# Patient Record
Sex: Female | Born: 1945 | Race: Black or African American | Hispanic: No | Smoking: Current every day smoker
Health system: Southern US, Community
[De-identification: ages and names within clinical notes are randomized; demographics above are authoritative.]

## PROBLEM LIST (undated history)

## (undated) DIAGNOSIS — I10 Essential (primary) hypertension: Secondary | ICD-10-CM

## (undated) DIAGNOSIS — J449 Chronic obstructive pulmonary disease, unspecified: Secondary | ICD-10-CM

## (undated) DIAGNOSIS — E7800 Pure hypercholesterolemia, unspecified: Secondary | ICD-10-CM

## (undated) DIAGNOSIS — I4891 Unspecified atrial fibrillation: Secondary | ICD-10-CM

## (undated) HISTORY — PX: ABDOMINAL HYSTERECTOMY: SHX81

## (undated) HISTORY — PX: BACK SURGERY: SHX140

---

## 2008-04-07 ENCOUNTER — Observation Stay (HOSPITAL_COMMUNITY): Admission: RE | Admit: 2008-04-07 | Discharge: 2008-04-07 | Payer: Self-pay | Admitting: Neurosurgery

## 2008-05-02 ENCOUNTER — Encounter: Admission: RE | Admit: 2008-05-02 | Discharge: 2008-05-02 | Payer: Self-pay | Admitting: Neurosurgery

## 2008-05-07 ENCOUNTER — Ambulatory Visit (HOSPITAL_COMMUNITY): Admission: RE | Admit: 2008-05-07 | Discharge: 2008-05-08 | Payer: Self-pay | Admitting: Neurosurgery

## 2008-07-27 ENCOUNTER — Encounter: Admission: RE | Admit: 2008-07-27 | Discharge: 2008-10-25 | Payer: Self-pay | Admitting: Neurosurgery

## 2008-12-08 ENCOUNTER — Emergency Department (HOSPITAL_BASED_OUTPATIENT_CLINIC_OR_DEPARTMENT_OTHER): Admission: EM | Admit: 2008-12-08 | Discharge: 2008-12-09 | Payer: Self-pay | Admitting: Emergency Medicine

## 2008-12-08 ENCOUNTER — Ambulatory Visit: Payer: Self-pay | Admitting: Diagnostic Radiology

## 2009-05-19 ENCOUNTER — Emergency Department (HOSPITAL_BASED_OUTPATIENT_CLINIC_OR_DEPARTMENT_OTHER): Admission: EM | Admit: 2009-05-19 | Discharge: 2009-05-19 | Payer: Self-pay | Admitting: Emergency Medicine

## 2009-05-19 ENCOUNTER — Ambulatory Visit: Payer: Self-pay | Admitting: Diagnostic Radiology

## 2011-02-04 LAB — URINE CULTURE: Colony Count: 9000

## 2011-02-04 LAB — CBC
Hemoglobin: 14.1 g/dL (ref 12.0–15.0)
MCHC: 33.2 g/dL (ref 30.0–36.0)
MCV: 89.6 fL (ref 78.0–100.0)
RBC: 4.73 MIL/uL (ref 3.87–5.11)
RDW: 13.9 % (ref 11.5–15.5)

## 2011-02-04 LAB — DIFFERENTIAL
Lymphocytes Relative: 16 % (ref 12–46)
Lymphs Abs: 1.2 10*3/uL (ref 0.7–4.0)
Neutro Abs: 5.7 10*3/uL (ref 1.7–7.7)
Neutrophils Relative %: 78 % — ABNORMAL HIGH (ref 43–77)

## 2011-02-04 LAB — URINALYSIS, ROUTINE W REFLEX MICROSCOPIC
Nitrite: NEGATIVE
Specific Gravity, Urine: 1.026 (ref 1.005–1.030)
Urobilinogen, UA: 1 mg/dL (ref 0.0–1.0)
pH: 5.5 (ref 5.0–8.0)

## 2011-02-04 LAB — COMPREHENSIVE METABOLIC PANEL
CO2: 26 mEq/L (ref 19–32)
Calcium: 8.9 mg/dL (ref 8.4–10.5)
Creatinine, Ser: 1 mg/dL (ref 0.4–1.2)
GFR calc Af Amer: >60 mL/min (ref >60)
GFR calc non Af Amer: 56 mL/min — ABNORMAL LOW (ref >60)
Glucose, Bld: 96 mg/dL (ref 70–99)
Total Protein: 7.3 g/dL (ref 6.0–8.3)

## 2011-02-04 LAB — URINE MICROSCOPIC-ADD ON

## 2011-02-13 LAB — POCT B-TYPE NATRIURETIC PEPTIDE (BNP): B Natriuretic Peptide, POC: 7.5 pg/mL (ref 0–100)

## 2011-02-13 LAB — DIFFERENTIAL
Eosinophils Absolute: 0.4 10*3/uL (ref 0.0–0.7)
Lymphs Abs: 4.9 10*3/uL — ABNORMAL HIGH (ref 0.7–4.0)
Neutro Abs: 4 10*3/uL (ref 1.7–7.7)
Neutrophils Relative %: 40 % — ABNORMAL LOW (ref 43–77)

## 2011-02-13 LAB — CBC
MCV: 91.9 fL (ref 78.0–100.0)
Platelets: 147 10*3/uL — ABNORMAL LOW (ref 150–400)
WBC: 10 10*3/uL (ref 4.0–10.5)

## 2011-02-13 LAB — BASIC METABOLIC PANEL
BUN: 13 mg/dL (ref 6–23)
Calcium: 9.6 mg/dL (ref 8.4–10.5)
Chloride: 106 mEq/L (ref 96–112)
Creatinine, Ser: 0.7 mg/dL (ref 0.4–1.2)
GFR calc non Af Amer: >60 mL/min (ref >60)

## 2011-02-13 LAB — D-DIMER, QUANTITATIVE: D-Dimer, Quant: 0.7 ug/mL-FEU — ABNORMAL HIGH (ref 0.00–0.48)

## 2011-02-13 LAB — POCT CARDIAC MARKERS: CKMB, poc: 1.8 ng/mL (ref 1.0–8.0)

## 2011-03-13 NOTE — Op Note (Signed)
Anne West, Anne West           ACCOUNT NO.:  [id number]   MEDICAL RECORD NO.:  [id number]          PATIENT TYPE:  OBV   LOCATION:  3534                         FACILITY:  [facility]   PHYSICIAN:  Coletta Memos, M.D.     DATE OF BIRTH:  Oct 02, 1946   DATE OF PROCEDURE:  04/07/2008  DATE OF DISCHARGE:  04/07/2008                               OPERATIVE REPORT   PREOPERATIVE DIAGNOSES:  1. Displaced disk, left, L5-S1.  2. Left S1 radiculopathy.   POSTOPERATIVE DIAGNOSES:  1. Displaced disk, left, L5-S1.  2. Left S1 radiculopathy.   PROCEDURE:  Left L5-S1 semi-hemilaminectomy and diskectomy with  microdissection.   COMPLICATIONS:  None.   INDICATIONS:  [name] has had significant pain in the left lower  extremity.  MRI showed a herniated disk at L5-S1.  With no benefit from  conservative treatment I offered, she agreed to undergo operative  decompression.   OPERATIVE NOTE:  [name] was brought to the operating room  intubated, placed under general anesthetic without difficulty.  She was  rolled prone onto a Wilson frame and all pressure points were properly  padded.  Her back was prepped.  She was draped in sterile fashion.  I  infiltrated 0.5% lidocaine and 1:200,000 strength of epinephrine into  the paraspinous musculature.  I opened the skin with a #10 blade and  took this down to the thoracolumbar fascia.  I exposed the lamina of L4,  L5, and S1.  Intraoperative x-ray showed I was in the correct  interlaminar space at L5-S1.  I then performed a semi-hemilaminectomy of  L5 and then removed the ligamentum flavum and exposed the thecal sac  between L5 and S1.  I brought the microscope into the operative field  and with microdissection retracted thecal sac medially and performed a  diskectomy at L5-S1 using pituitary rongeurs, curettes, and Kerrison  punches.  Dr. Channing Mutters assisted with the diskectomy.  After adequate  decompression, I inspected the nerve root and felt that  there was no  pressure on it.  I then irrigated the wound.  I then closed the wound in  layered fashion using Vicryl sutures.  I reapproximated the  thoracolumbar fascia and the subcuticular layer.  Dermabond was used for  sterile dressing.  She tolerated the procedure well.           ______________________________  Coletta Memos, M.D.     KC/MEDQ  D:  04/07/2008  T:  04/07/2008  Job:  [id number]

## 2011-03-13 NOTE — Discharge Summary (Signed)
Anne West, Anne West           ACCOUNT NO.:  [id number]   MEDICAL RECORD NO.:  [id number]          PATIENT TYPE:  OBV   LOCATION:  3534                         FACILITY:  [facility]   PHYSICIAN:  Coletta Memos, M.D.     DATE OF BIRTH:  Dec 02, 1945   DATE OF ADMISSION:  04/07/2008  DATE OF DISCHARGE:  04/07/2008                               DISCHARGE SUMMARY   ADMITTING DIAGNOSES:  1. Left L5-S1 disk displacement.  2. Left S1 radiculopathy.   POSTOPERATIVE DIAGNOSES:  1. Left L5-S1 disk displacement.  2. Left S1 radiculopathy.   PROCEDURE:  Left L5 hemilaminectomy and diskectomy with microdissection.   COMPLICATIONS:  None.   DISCHARGE STATUS:  Alive and well.   The patient has urinated, walked, and eaten a normal diet at the time of  discharge.  Her wound is clean and dry.  There are no signs of  infection.  A 5/5 strength in lower extremities.  She was given Vicodin  and Flexeril as discharge medications and given instructions as to no  driving for 2 weeks.           ______________________________  Coletta Memos, M.D.     KC/MEDQ  D:  04/07/2008  T:  04/08/2008  Job:  [id number]

## 2011-03-13 NOTE — Op Note (Signed)
NAMEJOOD, RETANA           ACCOUNT NO.:  [id number]   MEDICAL RECORD NO.:  [id number]          PATIENT TYPE:  OIB   LOCATION:  3011                         FACILITY:  [facility]   PHYSICIAN:  Coletta Memos, M.D.     DATE OF BIRTH:  10-Jul-1946   DATE OF PROCEDURE:  DATE OF DISCHARGE:                               OPERATIVE REPORT   PREOPERATIVE DIAGNOSIS:  Recurrent disk herniation, L5-S1 left.   POSTOPERATIVE DIAGNOSIS:  Recurrent disk herniation, L5-S1 left.   PROCEDURE:  Redo left L5-S1 semihemilaminectomy and diskectomy with  microdissection.   COMPLICATIONS:  None.   SURGEON:  Coletta Memos, MD   ANESTHESIA:  General endotracheal.   INDICATIONS:  Mrs. Erker underwent a lumbar laminectomy at L5-S1 for  disk resection.  She did well for approximately 2 weeks and have  recurrence of her pain.  MRI strongly suggested a recurrence with a  great deal of scar tissue.  I therefore recommended she agreed to  undergo operative decompression.   OPERATIVE NOTE:  Mrs. Mihalic was brought to the operating room,  intubated and placed under general anesthetic without difficulty.  I  used old incision as a guide and opened that with a #10 blade.  I took  this down to the thoracolumbar fascia.  I then exposed the lamina and  that was the L5 lamina, which I confirmed on x-ray.  I then proceeded to  increase the size of my previous laminectomy using a high-speed drill  and Kerrison punches.  I was able to then identify the thecal sac  retracted medially.  I identified the disk space and removed what was  great deal of disk material from the disk space.  I then irrigated.  I  then inspected my nerve root and felt that I had done a full  decompression.  This was done with microdissection.  I then irrigated.  I then closed the wound after placing Depo-Medrol over the nerve root.  Closed the wound in layered fashion using Vicryl sutures to  reapproximate the thoracolumbar, subcutaneous,  and subcuticular layers.  I used Dermabond as a sterile dressing.           ______________________________  Coletta Memos, M.D.     KC/MEDQ  D:  05/07/2008  T:  05/08/2008  Job:  [id number]

## 2011-04-21 ENCOUNTER — Emergency Department (INDEPENDENT_AMBULATORY_CARE_PROVIDER_SITE_OTHER): Payer: Medicare Other

## 2011-04-21 ENCOUNTER — Emergency Department (HOSPITAL_BASED_OUTPATIENT_CLINIC_OR_DEPARTMENT_OTHER)
Admission: EM | Admit: 2011-04-21 | Discharge: 2011-04-21 | Disposition: A | Discharge status: Home / Self Care | Payer: Medicare Other | Attending: Emergency Medicine | Admitting: Emergency Medicine

## 2011-04-21 DIAGNOSIS — M542 Cervicalgia: Secondary | ICD-10-CM

## 2011-04-21 DIAGNOSIS — M25519 Pain in unspecified shoulder: Secondary | ICD-10-CM | POA: Insufficient documentation

## 2011-04-21 DIAGNOSIS — I10 Essential (primary) hypertension: Secondary | ICD-10-CM | POA: Insufficient documentation

## 2011-07-26 LAB — CBC
HCT: 40.5
HCT: 42.3
Hemoglobin: 14.3
MCV: 90.2
MCV: 90
Platelets: 169
Platelets: 129 — ABNORMAL LOW
RBC: 4.7
RDW: 15.2
WBC: 9.7

## 2011-07-26 LAB — BASIC METABOLIC PANEL
BUN: 5 — ABNORMAL LOW
BUN: 8
Chloride: 106
Creatinine, Ser: 0.63
GFR calc non Af Amer: >60
GFR calc non Af Amer: >60
Glucose, Bld: 82
Potassium: 3.8

## 2011-09-17 ENCOUNTER — Emergency Department (HOSPITAL_BASED_OUTPATIENT_CLINIC_OR_DEPARTMENT_OTHER)
Admission: EM | Admit: 2011-09-17 | Discharge: 2011-09-17 | Disposition: A | Discharge status: Home / Self Care | Payer: Medicare Other | Attending: Emergency Medicine | Admitting: Emergency Medicine

## 2011-09-17 ENCOUNTER — Encounter: Payer: Self-pay | Admitting: Emergency Medicine

## 2011-09-17 ENCOUNTER — Emergency Department (INDEPENDENT_AMBULATORY_CARE_PROVIDER_SITE_OTHER): Payer: Medicare Other

## 2011-09-17 DIAGNOSIS — I10 Essential (primary) hypertension: Secondary | ICD-10-CM | POA: Insufficient documentation

## 2011-09-17 DIAGNOSIS — R509 Fever, unspecified: Secondary | ICD-10-CM

## 2011-09-17 DIAGNOSIS — R05 Cough: Secondary | ICD-10-CM

## 2011-09-17 DIAGNOSIS — B9789 Other viral agents as the cause of diseases classified elsewhere: Secondary | ICD-10-CM | POA: Insufficient documentation

## 2011-09-17 DIAGNOSIS — R0902 Hypoxemia: Secondary | ICD-10-CM | POA: Insufficient documentation

## 2011-09-17 DIAGNOSIS — Z79899 Other long term (current) drug therapy: Secondary | ICD-10-CM | POA: Insufficient documentation

## 2011-09-17 DIAGNOSIS — J029 Acute pharyngitis, unspecified: Secondary | ICD-10-CM | POA: Insufficient documentation

## 2011-09-17 DIAGNOSIS — J3489 Other specified disorders of nose and nasal sinuses: Secondary | ICD-10-CM | POA: Insufficient documentation

## 2011-09-17 HISTORY — DX: Essential (primary) hypertension: I10

## 2011-09-17 LAB — CBC
HCT: 39.6 % (ref 36.0–46.0)
MCH: 29.8 pg (ref 26.0–34.0)
MCHC: 33.8 g/dL (ref 30.0–36.0)
MCV: 88 fL (ref 78.0–100.0)
RDW: 14.8 % (ref 11.5–15.5)

## 2011-09-17 LAB — BASIC METABOLIC PANEL
BUN: 13 mg/dL (ref 6–23)
Calcium: 9 mg/dL (ref 8.4–10.5)
Creatinine, Ser: 0.8 mg/dL (ref 0.50–1.10)
GFR calc Af Amer: 86 mL/min — ABNORMAL LOW (ref >90)

## 2011-09-17 LAB — POCT I-STAT 3, ART BLOOD GAS (G3+)
Acid-base deficit: 1 mmol/L (ref 0.0–2.0)
Bicarbonate: 24.2 mEq/L — ABNORMAL HIGH (ref 20.0–24.0)
O2 Saturation: 92 %
pCO2 arterial: 41.5 mmHg (ref 35.0–45.0)
pO2, Arterial: 66 mmHg — ABNORMAL LOW (ref 80.0–100.0)

## 2011-09-17 LAB — DIFFERENTIAL
Basophils Absolute: 0 10*3/uL (ref 0.0–0.1)
Basophils Relative: 0 % (ref 0–1)
Eosinophils Absolute: 0 10*3/uL (ref 0.0–0.7)
Eosinophils Relative: 0 % (ref 0–5)
Monocytes Absolute: 0.9 10*3/uL (ref 0.1–1.0)
Monocytes Relative: 9 % (ref 3–12)

## 2011-09-17 LAB — URINALYSIS, ROUTINE W REFLEX MICROSCOPIC
Bilirubin Urine: NEGATIVE
Ketones, ur: 15 mg/dL — AB
Leukocytes, UA: NEGATIVE
Nitrite: NEGATIVE
Protein, ur: 30 mg/dL — AB

## 2011-09-17 LAB — URINE MICROSCOPIC-ADD ON

## 2011-09-17 LAB — LACTIC ACID, PLASMA: Lactic Acid, Venous: 1.3 mmol/L (ref 0.5–2.2)

## 2011-09-17 MED ORDER — ALBUTEROL SULFATE (5 MG/ML) 0.5% IN NEBU
5.0000 mg | INHALATION_SOLUTION | Freq: Once | RESPIRATORY_TRACT | Status: AC
  Administered 2011-09-17: 5 mg via RESPIRATORY_TRACT
  Filled 2011-09-17: qty 1

## 2011-09-17 MED ORDER — METHYLPREDNISOLONE SODIUM SUCC 125 MG IJ SOLR
125.0000 mg | Freq: Once | INTRAMUSCULAR | Status: AC
  Administered 2011-09-17: 125 mg via INTRAVENOUS
  Filled 2011-09-17: qty 2

## 2011-09-17 MED ORDER — KETOROLAC TROMETHAMINE 30 MG/ML IJ SOLN
30.0000 mg | Freq: Once | INTRAMUSCULAR | Status: AC
  Administered 2011-09-17: 30 mg via INTRAVENOUS
  Filled 2011-09-17: qty 1

## 2011-09-17 MED ORDER — SODIUM CHLORIDE 0.9 % IV SOLN: Freq: Once | INTRAVENOUS | Status: DC

## 2011-09-17 MED ORDER — ALBUTEROL SULFATE (5 MG/ML) 0.5% IN NEBU: 5.0000 mg | INHALATION_SOLUTION | Freq: Once | RESPIRATORY_TRACT | Status: DC

## 2011-09-17 MED ORDER — IPRATROPIUM BROMIDE 0.02 % IN SOLN
0.5000 mg | Freq: Once | RESPIRATORY_TRACT | Status: AC
  Administered 2011-09-17: 0.5 mg via RESPIRATORY_TRACT
  Filled 2011-09-17: qty 2.5

## 2011-09-17 MED ORDER — SODIUM CHLORIDE 0.9 % IV BOLUS (SEPSIS): 1000.0000 mL | Freq: Once | INTRAVENOUS | Status: DC

## 2011-09-17 MED ORDER — ACETAMINOPHEN 325 MG PO TABS
650.0000 mg | ORAL_TABLET | Freq: Once | ORAL | Status: AC
  Administered 2011-09-17: 650 mg via ORAL
  Filled 2011-09-17: qty 2

## 2011-09-17 NOTE — ED Notes (Signed)
Pt c/o flu-like symptoms since yesterday

## 2011-09-17 NOTE — ED Provider Notes (Signed)
History  This chart was scribed for Dayton Bailiff, MD by Bennett Scrape. This patient was seen in room MH11/MH11 and the patient's care was started at 7:28PM.  CSN: [id number] Arrival date & time: 09/17/2011  7:16 PM   First MD Initiated Contact with Patient 09/17/11 1918      Chief Complaint  Patient presents with  . Cough  . Nasal Congestion  . Fever  . Sore Throat    The history is provided by the patient. No language interpreter was used.   Anne West is a 65 y.o. female who presents to the Emergency Department complaining of one day of gradual onset of a productive cough with clear sputum that has not improved with associated rhinorrhea/congestion and intermittent nausea. Pt denies knowledge of fever but was measured at 103 in the ED today. Has associated nausea.  Pt denies vomiting, chest pain, headaches and sore throat. Pt did not have a flu shot recently. Pt denies any h/o medical problems and takes vitamins daily.  No ill contacts.  Denies abdominal pain, urinary sx.   Past Medical History  Diagnosis Date  . Hypertension     Past Surgical History  Procedure Date  . Back surgery   . Abdominal hysterectomy     No family history on file.  History  Substance Use Topics  . Smoking status: Current Everyday Smoker  . Smokeless tobacco: Not on file  . Alcohol Use: No    Review of Systems A complete 10 system review of systems was obtained and is otherwise negative except as noted in the HPI.   Allergies  Penicillins cross reactors and Sulfa antibiotics  Home Medications   Current Outpatient Rx  Name Route Sig Dispense Refill  . ECHINACEA 350 MG PO CAPS Oral Take 1 capsule by mouth daily.      . OMEGA-3 FATTY ACIDS 1000 MG PO CAPS Oral Take 1 g by mouth daily.      . GUAIFENESIN 100 MG/5ML PO SYRP Oral Take 200 mg by mouth 3 (three) times daily as needed. For congestion     . IBUPROFEN 200 MG PO TABS Oral Take 400 mg by mouth every 6 (six) hours as  needed. For fever and pain     . PHENYLEPHRINE-DM-GG-APAP 5-10-200-325 MG/10ML PO LIQD Oral Take 20 mLs by mouth every 6 (six) hours as needed. For congestion     . VITAMIN C 500 MG PO TABS Oral Take 500 mg by mouth daily.        BP 123/58  Pulse 97  Temp(Src) 99.2 F (37.3 C) (Oral)  Resp 25  Ht 5\' 8"  (1.727 m)  Wt 189 lb (85.73 kg)  BMI 28.74 kg/m2  SpO2 98%  Physical Exam  Nursing note and vitals reviewed. Constitutional: She is oriented to person, place, and time. She appears well-developed and well-nourished.  HENT:  Head: Normocephalic and atraumatic.  Right Ear: External ear normal.  Left Ear: External ear normal.  Mouth/Throat: Oropharynx is clear and moist.  Eyes: EOM are normal. Pupils are equal, round, and reactive to light.  Neck: Normal range of motion. Neck supple.  Cardiovascular: Regular rhythm.  Exam reveals no gallop and no friction rub.   No murmur heard.      Tachycardic  Pulmonary/Chest:       Lungs are diminished throughout  Abdominal: Soft. There is no tenderness.  Lymphadenopathy:    She has no cervical adenopathy.  Neurological: She is alert and oriented to person, place, and  time.  Skin: Skin is warm and dry.  Psychiatric: She has a normal mood and affect. Her behavior is normal.    ED Course  Procedures (including critical care time)  DIAGNOSTIC STUDIES: Oxygen Saturation is 90% on room air, low by my interpretation.    COORDINATION OF CARE: 7:32PM-Discussed chest x-ray order, urinalysis and blood panel with patient at bedside and patient agreed to plan.   Labs Reviewed  DIFFERENTIAL - Abnormal; Notable for the following:    Neutrophils Relative 85 (*)    Neutro Abs 8.2 (*)    Lymphocytes Relative 6 (*)    Lymphs Abs 0.6 (*)    All other components within normal limits  BASIC METABOLIC PANEL - Abnormal; Notable for the following:    Glucose, Bld 118 (*)    GFR calc non Af Amer 75 (*)    GFR calc Af Amer 86 (*)    All other  components within normal limits  URINALYSIS, ROUTINE W REFLEX MICROSCOPIC - Abnormal; Notable for the following:    Color, Urine AMBER (*) BIOCHEMICALS MAY BE AFFECTED BY COLOR   Ketones, ur 15 (*)    Protein, ur 30 (*)    All other components within normal limits  URINE MICROSCOPIC-ADD ON - Abnormal; Notable for the following:    Casts HYALINE CASTS (*)    All other components within normal limits  CBC  LACTIC ACID, PLASMA  URINE CULTURE  CULTURE, BLOOD (ROUTINE X 2)  CULTURE, BLOOD (ROUTINE X 2)  POCT RAPID INFLUENZA A&B  INFLUENZA PANEL BY PCR  I-STAT 3, BLOOD GAS (G3+)   Dg Chest 2 View  09/17/2011  *RADIOLOGY REPORT*  Clinical Data: Fever, cough  CHEST - 2 VIEW  Comparison: 05/19/2009  Findings: Normal heart size and vascularity.  Stable slight vascular prominence without definite edema, pneumonia, collapse, consolidation, effusion or pneumothorax.  Trachea midline.  IMPRESSION: No acute finding.  Original Report Authenticated By: Judie Petit. Ruel Favors, M.D.     1. Viral respiratory illness   2. Hypoxia       MDM  Patient reassessed after 2 breathing treatments. She has slight increase in air movement without wheezing. She remained hypoxic off oxygen before ambulation. She does not require home oxygen. Laboratory studies are relatively unremarkable as was her chest x-ray. Administered a dose of steroids as well. Given the new oxygen requirement patient will require admission to the hospital for further evaluation and treatment. I am concerned about influenza as a cause of her illness therefore a PCR will be sent. I am not concerned about pulmonary embolus at this time as I feel her symptoms are secondary to her acute illness. A blood gas will be obtained per the hospitalist request however I do feel that she is safe medical floor. Initially discussed the case the [facility] however the patient then changed her mind and wished to be admitted to [facility]. I discussed  the case with the [facility] hospitalist Dr. Ninfa Linden who accepted the patient for admission to a monitored bed.  To followup on the blood gas which showed a normal pH and CO2. Bicarbonate level was relatively normal. Her PO2 was 66 on 2 L.      I personally performed the services described in this documentation, which was scribed in my presence. The recorded information has been reviewed and considered.   Dayton Bailiff, MD 09/17/11 2249

## 2011-09-18 LAB — INFLUENZA PANEL BY PCR (TYPE A & B): Influenza B By PCR: NEGATIVE

## 2011-09-18 NOTE — ED Notes (Signed)
Called floor for room 601 and informed staff that lab for FLU A positive, b negative

## 2011-09-19 LAB — URINE CULTURE
Colony Count: 55000
Culture  Setup Time: 201211200424

## 2011-09-24 LAB — CULTURE, BLOOD (ROUTINE X 2)
Culture  Setup Time: 201211200228
Culture: NO GROWTH

## 2013-04-06 ENCOUNTER — Emergency Department (HOSPITAL_BASED_OUTPATIENT_CLINIC_OR_DEPARTMENT_OTHER)
Admission: EM | Admit: 2013-04-06 | Discharge: 2013-04-06 | Disposition: A | Discharge status: Other Acute Inpatient Hospital | Payer: Medicare Other | Attending: Emergency Medicine | Admitting: Emergency Medicine

## 2013-04-06 ENCOUNTER — Emergency Department (HOSPITAL_BASED_OUTPATIENT_CLINIC_OR_DEPARTMENT_OTHER): Payer: Medicare Other

## 2013-04-06 ENCOUNTER — Encounter (HOSPITAL_BASED_OUTPATIENT_CLINIC_OR_DEPARTMENT_OTHER): Payer: Self-pay

## 2013-04-06 ENCOUNTER — Other Ambulatory Visit: Payer: Self-pay

## 2013-04-06 DIAGNOSIS — J441 Chronic obstructive pulmonary disease with (acute) exacerbation: Secondary | ICD-10-CM

## 2013-04-06 DIAGNOSIS — F17200 Nicotine dependence, unspecified, uncomplicated: Secondary | ICD-10-CM | POA: Insufficient documentation

## 2013-04-06 DIAGNOSIS — Z7982 Long term (current) use of aspirin: Secondary | ICD-10-CM | POA: Insufficient documentation

## 2013-04-06 DIAGNOSIS — I10 Essential (primary) hypertension: Secondary | ICD-10-CM | POA: Insufficient documentation

## 2013-04-06 DIAGNOSIS — Z79899 Other long term (current) drug therapy: Secondary | ICD-10-CM | POA: Insufficient documentation

## 2013-04-06 DIAGNOSIS — Z880 Allergy status to penicillin: Secondary | ICD-10-CM | POA: Insufficient documentation

## 2013-04-06 DIAGNOSIS — R05 Cough: Secondary | ICD-10-CM | POA: Insufficient documentation

## 2013-04-06 DIAGNOSIS — R059 Cough, unspecified: Secondary | ICD-10-CM | POA: Insufficient documentation

## 2013-04-06 DIAGNOSIS — E7800 Pure hypercholesterolemia, unspecified: Secondary | ICD-10-CM | POA: Insufficient documentation

## 2013-04-06 HISTORY — DX: Pure hypercholesterolemia, unspecified: E78.00

## 2013-04-06 HISTORY — DX: Chronic obstructive pulmonary disease, unspecified: J44.9

## 2013-04-06 LAB — CBC WITH DIFFERENTIAL/PLATELET
Basophils Relative: 0 % (ref 0–1)
Eosinophils Absolute: 0.2 10*3/uL (ref 0.0–0.7)
HCT: 40.4 % (ref 36.0–46.0)
Hemoglobin: 13.7 g/dL (ref 12.0–15.0)
Lymphs Abs: 1.4 10*3/uL (ref 0.7–4.0)
MCH: 30.6 pg (ref 26.0–34.0)
MCHC: 33.9 g/dL (ref 30.0–36.0)
Monocytes Absolute: 0.3 10*3/uL (ref 0.1–1.0)
Monocytes Relative: 3 % (ref 3–12)
Neutro Abs: 7.5 10*3/uL (ref 1.7–7.7)
Neutrophils Relative %: 80 % — ABNORMAL HIGH (ref 43–77)
RBC: 4.48 MIL/uL (ref 3.87–5.11)

## 2013-04-06 LAB — BASIC METABOLIC PANEL
BUN: 12 mg/dL (ref 6–23)
Chloride: 97 mEq/L (ref 96–112)
Creatinine, Ser: 0.8 mg/dL (ref 0.50–1.10)
Glucose, Bld: 146 mg/dL — ABNORMAL HIGH (ref 70–99)
Potassium: 3.7 mEq/L (ref 3.5–5.1)

## 2013-04-06 MED ORDER — ALBUTEROL SULFATE (5 MG/ML) 0.5% IN NEBU
5.0000 mg | INHALATION_SOLUTION | Freq: Once | RESPIRATORY_TRACT | Status: AC
  Administered 2013-04-06: 5 mg via RESPIRATORY_TRACT
  Filled 2013-04-06: qty 1

## 2013-04-06 MED ORDER — IPRATROPIUM BROMIDE 0.02 % IN SOLN
0.5000 mg | Freq: Once | RESPIRATORY_TRACT | Status: AC
  Administered 2013-04-06: 0.5 mg via RESPIRATORY_TRACT
  Filled 2013-04-06 (×2): qty 2.5

## 2013-04-06 MED ORDER — IPRATROPIUM BROMIDE 0.02 % IN SOLN
RESPIRATORY_TRACT | Status: AC
  Administered 2013-04-06: 0.5 mg
  Filled 2013-04-06: qty 2.5

## 2013-04-06 MED ORDER — METHYLPREDNISOLONE SODIUM SUCC 125 MG IJ SOLR
125.0000 mg | Freq: Once | INTRAMUSCULAR | Status: AC
  Administered 2013-04-06: 125 mg via INTRAMUSCULAR
  Filled 2013-04-06: qty 2

## 2013-04-06 MED ORDER — IPRATROPIUM BROMIDE 0.02 % IN SOLN
0.5000 mg | Freq: Once | RESPIRATORY_TRACT | Status: AC
  Administered 2013-04-06: 0.5 mg via RESPIRATORY_TRACT
  Filled 2013-04-06: qty 2.5

## 2013-04-06 MED ORDER — ALBUTEROL SULFATE (5 MG/ML) 0.5% IN NEBU
INHALATION_SOLUTION | RESPIRATORY_TRACT | Status: AC
  Administered 2013-04-06: 10 mg via RESPIRATORY_TRACT
  Filled 2013-04-06: qty 0.5

## 2013-04-06 MED ORDER — ALBUTEROL SULFATE (5 MG/ML) 0.5% IN NEBU
INHALATION_SOLUTION | RESPIRATORY_TRACT | Status: AC
  Administered 2013-04-06: 5 mg via RESPIRATORY_TRACT
  Filled 2013-04-06: qty 1

## 2013-04-06 MED ORDER — ALBUTEROL (5 MG/ML) CONTINUOUS INHALATION SOLN
10.0000 mg/h | INHALATION_SOLUTION | RESPIRATORY_TRACT | Status: AC
  Administered 2013-04-06: 5 mg via RESPIRATORY_TRACT
  Administered 2013-04-06: 10 mg via RESPIRATORY_TRACT
  Filled 2013-04-06: qty 20

## 2013-04-06 NOTE — ED Notes (Signed)
Ambulated pt throughout the department. Pt resting SPO2 92% and HR 103.  After ambulating SP02 Remained 92% and HR increased to 111. Pt states "I just had a coughing spell. I coughed up some stuff but I just fell like it want come up."  Pt noted to have increase WOB along with pursed lip breathing.

## 2013-04-06 NOTE — ED Notes (Signed)
Patient reports that she developed shortness of breath yesterday afternoon. Patient used nebs and rescue inhaler without relief. Pursed lip breathing on arrival with dry cough

## 2013-04-06 NOTE — ED Provider Notes (Addendum)
History     CSN: [id number]  Arrival date & time 04/06/13  4098   First MD Initiated Contact with Patient 04/06/13 [phone]      Chief Complaint  Patient presents with  . Shortness of Breath    (Consider location/radiation/quality/duration/timing/severity/associated sxs/prior treatment) Patient is a 67 y.o. female presenting with shortness of breath. The history is provided by the patient.  Shortness of Breath Severity:  Severe Onset quality:  Gradual Duration:  2 days Timing:  Constant Progression:  Worsening Chronicity:  Recurrent Context: activity   Context comment:  Started when she was at the beach 2 days ago and then worsened when she got home yesterday.  Last night had to use inhaler frequently for coughing, wheezing and sob Relieved by:  Inhaler Worsened by:  Activity and weather changes Associated symptoms: cough and wheezing   Associated symptoms: no abdominal pain, no chest pain, no fever, no sputum production and no vomiting   Risk factors comment:  Copd   Past Medical History  Diagnosis Date  . Hypertension   . COPD (chronic obstructive pulmonary disease)   . High cholesterol     Past Surgical History  Procedure Laterality Date  . Back surgery    . Abdominal hysterectomy      No family history on file.  History  Substance Use Topics  . Smoking status: Current Every Day Smoker    Types: Cigarettes  . Smokeless tobacco: Not on file  . Alcohol Use: No    OB History   Grav Para Term Preterm Abortions TAB SAB Ect Mult Living                  Review of Systems  Constitutional: Negative for fever.  Respiratory: Positive for cough, shortness of breath and wheezing. Negative for sputum production.   Cardiovascular: Negative for chest pain.  Gastrointestinal: Negative for vomiting and abdominal pain.  All other systems reviewed and are negative.    Allergies  Penicillins cross reactors and Sulfa antibiotics  Home Medications   Current  Outpatient Rx  Name  Route  Sig  Dispense  Refill  . albuterol (PROVENTIL HFA;VENTOLIN HFA) 108 (90 BASE) MCG/ACT inhaler   Inhalation   Inhale 2 puffs into the lungs every 6 (six) hours as needed for wheezing.         Marland Kitchen aspirin 81 MG chewable tablet   Oral   Chew 81 mg by mouth daily.         Marland Kitchen atorvastatin (LIPITOR) 40 MG tablet   Oral   Take 40 mg by mouth daily.         . fluticasone-salmeterol (ADVAIR HFA) 230-21 MCG/ACT inhaler   Inhalation   Inhale 2 puffs into the lungs 2 (two) times daily.         Marland Kitchen lisinopril (PRINIVIL,ZESTRIL) 20 MG tablet   Oral   Take 20 mg by mouth daily.         . montelukast (SINGULAIR) 10 MG tablet   Oral   Take 10 mg by mouth at bedtime.         Marland Kitchen guaifenesin (TUSSIN) 100 MG/5ML syrup   Oral   Take 200 mg by mouth 3 (three) times daily as needed. For congestion          . ibuprofen (ADVIL,MOTRIN) 200 MG tablet   Oral   Take 400 mg by mouth every 6 (six) hours as needed. For fever and pain          .  Phenylephrine-DM-GG-APAP (MUCINEX FAST-MAX COLD FLU) 5-10-200-325 MG/10ML LIQD   Oral   Take 20 mLs by mouth every 6 (six) hours as needed. For congestion          . vitamin C (ASCORBIC ACID) 500 MG tablet   Oral   Take 500 mg by mouth daily.             BP 133/79  Pulse 97  Resp 24  SpO2 99%  Physical Exam  Nursing note and vitals reviewed. Constitutional: She is oriented to person, place, and time. She appears well-developed and well-nourished. No distress.  HENT:  Head: Normocephalic and atraumatic.  Mouth/Throat: Oropharynx is clear and moist.  Eyes: Conjunctivae and EOM are normal. Pupils are equal, round, and reactive to light.  Neck: Normal range of motion. Neck supple.  Cardiovascular: Normal rate, regular rhythm and intact distal pulses.   No murmur heard. Pulmonary/Chest: Effort normal. No respiratory distress. She has decreased breath sounds. She has wheezes. She has no rales.  Abdominal: Soft.  She exhibits no distension. There is no tenderness. There is no rebound and no guarding.  Musculoskeletal: Normal range of motion. She exhibits no edema and no tenderness.  Neurological: She is alert and oriented to person, place, and time.  Skin: Skin is warm and dry. No rash noted. No erythema.  Psychiatric: She has a normal mood and affect. Her behavior is normal.    ED Course  Procedures (including critical care time)  Labs Reviewed - No data to display Dg Chest 2 View  04/06/2013   *RADIOLOGY REPORT*  Clinical Data: Short of breath.  Smoking history.  Hypertension.  CHEST - 2 VIEW  Comparison: 09/17/2011  Findings: Artifact overlies the chest.  Heart size is normal. Mediastinal shadows are normal.  There are chronically prominent bronchial markings suggesting chronic bronchitis.  No sign of infiltrate, mass, effusion or collapse.  No significant bony finding.  IMPRESSION: Chronic bronchitic changes.  No acute finding.   Original Report Authenticated By: Paulina Fusi, M.D.     Date: 04/06/2013  Rate: 92  Rhythm: normal sinus rhythm  QRS Axis: normal  Intervals: normal  ST/T Wave abnormalities: normal  Conduction Disutrbances: none  Narrative Interpretation: unremarkable      1. COPD exacerbation       MDM   Pt with typical COPD exacerbation  Symptoms after being at the beach this week.  No infectious sx, productive cough or other complaints.  Wheezing on exam.  will give steroids, albuterol/atrovent and recheck.  CXR pending.  Denies any sx concerning for PE, infection or cardiac etiology and EKG wnl.  10:00 AM Improvement in wheezing however diminished breath sounds and continued pursed lipped breathing.  Pt states still feels SOB and sats around 88-90% and 92 when walking.    10:50 AM Still diminished and SOB.  1 hr long neb ordered.  Will admit for obs.       Gwyneth Sprout, MD 04/06/13 1051  Gwyneth Sprout, MD 04/06/13 1100

## 2016-07-26 ENCOUNTER — Emergency Department (HOSPITAL_BASED_OUTPATIENT_CLINIC_OR_DEPARTMENT_OTHER)
Admission: EM | Admit: 2016-07-26 | Discharge: 2016-07-26 | Disposition: A | Discharge status: Home / Self Care | Payer: Medicare HMO | Attending: Emergency Medicine | Admitting: Emergency Medicine

## 2016-07-26 ENCOUNTER — Encounter (HOSPITAL_BASED_OUTPATIENT_CLINIC_OR_DEPARTMENT_OTHER): Payer: Self-pay

## 2016-07-26 DIAGNOSIS — F17210 Nicotine dependence, cigarettes, uncomplicated: Secondary | ICD-10-CM | POA: Insufficient documentation

## 2016-07-26 DIAGNOSIS — Z7982 Long term (current) use of aspirin: Secondary | ICD-10-CM | POA: Insufficient documentation

## 2016-07-26 DIAGNOSIS — I10 Essential (primary) hypertension: Secondary | ICD-10-CM | POA: Diagnosis not present

## 2016-07-26 DIAGNOSIS — L509 Urticaria, unspecified: Secondary | ICD-10-CM | POA: Insufficient documentation

## 2016-07-26 DIAGNOSIS — J449 Chronic obstructive pulmonary disease, unspecified: Secondary | ICD-10-CM | POA: Insufficient documentation

## 2016-07-26 DIAGNOSIS — R21 Rash and other nonspecific skin eruption: Secondary | ICD-10-CM

## 2016-07-26 HISTORY — DX: Unspecified atrial fibrillation: I48.91

## 2016-07-26 MED ORDER — PREDNISONE 50 MG PO TABS
60.0000 mg | ORAL_TABLET | Freq: Once | ORAL | Status: AC
  Administered 2016-07-26: 60 mg via ORAL
  Filled 2016-07-26: qty 1

## 2016-07-26 MED ORDER — HYDROXYZINE HCL 25 MG PO TABS
25.0000 mg | ORAL_TABLET | Freq: Once | ORAL | Status: AC
  Administered 2016-07-26: 25 mg via ORAL
  Filled 2016-07-26: qty 1

## 2016-07-26 MED ORDER — PREDNISONE 20 MG PO TABS: 20.0000 mg | ORAL_TABLET | Freq: Two times a day (BID) | ORAL | 0 refills | Status: DC

## 2016-07-26 MED ORDER — HYDROXYZINE HCL 25 MG PO TABS: 25.0000 mg | ORAL_TABLET | Freq: Four times a day (QID) | ORAL | 0 refills | Status: DC | PRN

## 2016-07-26 NOTE — ED Provider Notes (Addendum)
[facility] Provider Note   CSN: [id number] Arrival date & time: 07/26/16  2204   By signing my name below, I, Delton Prairie, attest that this documentation has been prepared under the direction and in the presence of Tanna Furry, MD  Electronically Signed: Delton Prairie, ED Scribe. 07/26/16. 11:24 PM.   History   Chief Complaint Chief Complaint  Patient presents with  . Rash   The history is provided by the patient. No language interpreter was used.   HPI Comments:  Anne West is a 70 y.o. female who presents to the Emergency Department complaining of a rash to her BUE and BLE onset 1 day ago. She notes the rash is also located to her neck. Pt was recently diagnosed with scabies and was prescribed a cream which has resolved. She notes today's symptoms are not similar to her symptoms when she had scabies. She has used calamine lotion and benadryl with no relief. Pt has taken tramadol 4 days ago for her back pain. Pt denies any other complaints at this time.   Past Medical History:  Diagnosis Date  . A-fib ([place])   . COPD (chronic obstructive pulmonary disease) ([place])   . High cholesterol   . Hypertension     There are no active problems to display for this patient.   Past Surgical History:  Procedure Laterality Date  . ABDOMINAL HYSTERECTOMY    . BACK SURGERY      OB History    No data available       Home Medications    Prior to Admission medications   Medication Sig Start Date End Date Taking? Authorizing Provider  albuterol (PROVENTIL HFA;VENTOLIN HFA) 108 (90 BASE) MCG/ACT inhaler Inhale 2 puffs into the lungs every 6 (six) hours as needed for wheezing.    Historical Provider, MD  aspirin 81 MG chewable tablet Chew 81 mg by mouth daily.    Historical Provider, MD  atorvastatin (LIPITOR) 40 MG tablet Take 40 mg by mouth daily.    Historical Provider, MD  fluticasone-salmeterol (ADVAIR HFA) 230-21 MCG/ACT inhaler Inhale 2 puffs into the lungs 2  (two) times daily.    Historical Provider, MD  guaifenesin (TUSSIN) 100 MG/5ML syrup Take 200 mg by mouth 3 (three) times daily as needed. For congestion     Historical Provider, MD  hydrOXYzine (ATARAX/VISTARIL) 25 MG tablet Take 1 tablet (25 mg total) by mouth every 6 (six) hours as needed for itching. 07/26/16   Tanna Furry, MD  ibuprofen (ADVIL,MOTRIN) 200 MG tablet Take 400 mg by mouth every 6 (six) hours as needed. For fever and pain     Historical Provider, MD  lisinopril (PRINIVIL,ZESTRIL) 20 MG tablet Take 20 mg by mouth daily.    Historical Provider, MD  montelukast (SINGULAIR) 10 MG tablet Take 10 mg by mouth at bedtime.    Historical Provider, MD  Phenylephrine-DM-GG-APAP (MUCINEX FAST-MAX COLD FLU) 5-10-200-325 MG/10ML LIQD Take 20 mLs by mouth every 6 (six) hours as needed. For congestion     Historical Provider, MD  predniSONE (DELTASONE) 20 MG tablet Take 1 tablet (20 mg total) by mouth 2 (two) times daily with a meal. 07/26/16   Tanna Furry, MD  vitamin C (ASCORBIC ACID) 500 MG tablet Take 500 mg by mouth daily.      Historical Provider, MD    Family History No family history on file.  Social History Social History  Substance Use Topics  . Smoking status: Current Every Day Smoker  Types: Cigarettes  . Smokeless tobacco: Never Used  . Alcohol use No     Allergies   Penicillins cross reactors and Sulfa antibiotics   Review of Systems Review of Systems  Constitutional: Negative for appetite change, chills, diaphoresis, fatigue and fever.  HENT: Negative for mouth sores, sore throat and trouble swallowing.   Eyes: Negative for visual disturbance.  Respiratory: Negative for cough, chest tightness, shortness of breath and wheezing.   Cardiovascular: Negative for chest pain.  Gastrointestinal: Negative for abdominal distention, abdominal pain, diarrhea, nausea and vomiting.  Endocrine: Negative for polydipsia, polyphagia and polyuria.  Genitourinary: Negative for dysuria,  frequency and hematuria.  Musculoskeletal: Negative for gait problem.  Skin: Positive for rash. Negative for color change and pallor.  Neurological: Negative for dizziness, syncope, light-headedness and headaches.  Hematological: Does not bruise/bleed easily.  Psychiatric/Behavioral: Negative for behavioral problems and confusion.     Physical Exam Updated Vital Signs BP 130/86 (BP Location: Left Arm)   Pulse 107   Temp 98.1 F (36.7 C) (Oral)   Resp 18   Ht 5\' 8"  (1.727 m)   Wt 182 lb (82.6 kg)   SpO2 94%   BMI 27.67 kg/m   Physical Exam  Constitutional: She is oriented to person, place, and time. She appears well-developed and well-nourished. No distress.  HENT:  Head: Normocephalic.  Eyes: Conjunctivae are normal. Pupils are equal, round, and reactive to light. No scleral icterus.  Neck: Normal range of motion. Neck supple. No thyromegaly present.  Cardiovascular: Normal rate and regular rhythm.  Exam reveals no gallop and no friction rub.   No murmur heard. Pulmonary/Chest: Effort normal and breath sounds normal. No respiratory distress. She has no wheezes. She has no rales.  Abdominal: Soft. Bowel sounds are normal. She exhibits no distension. There is no tenderness. There is no rebound.  Musculoskeletal: Normal range of motion.  Neurological: She is alert and oriented to person, place, and time.  Skin: Skin is warm and dry. No rash noted.  Tiny red papules consistent with recent scabies. Additional palpable elevated patches. Urticaria vs nodosum.   Psychiatric: She has a normal mood and affect. Her behavior is normal.     ED Treatments / Results  DIAGNOSTIC STUDIES:  Oxygen Saturation is 94% on RA, adequate by my interpretation.    COORDINATION OF CARE:  11:20 PM Discussed treatment plan with pt at bedside and pt agreed to plan.  Labs (all labs ordered are listed, but only abnormal results are displayed) Labs Reviewed - No data to display  EKG  EKG  Interpretation None       Radiology No results found.  Procedures Procedures (including critical care time)  Medications Ordered in ED Medications  predniSONE (DELTASONE) tablet 60 mg (not administered)  hydrOXYzine (ATARAX/VISTARIL) tablet 25 mg (not administered)     Initial Impression / Assessment and Plan / ED Course  I have reviewed the triage vital signs and the nursing notes.  Pertinent labs & imaging results that were available during my care of the patient were reviewed by me and considered in my medical decision making (see chart for details).  Clinical Course   Differential diagnosis would include urticaria. Could also possibly be erythema nodosum. Will use prednisone and Atarax. Primary care follow-up.   Final Clinical Impressions(s) / ED Diagnoses   Final diagnoses:  Rash  Urticaria    New Prescriptions New Prescriptions   HYDROXYZINE (ATARAX/VISTARIL) 25 MG TABLET    Take 1 tablet (25 mg total)  by mouth every 6 (six) hours as needed for itching.   PREDNISONE (DELTASONE) 20 MG TABLET    Take 1 tablet (20 mg total) by mouth 2 (two) times daily with a meal.  I personally performed the services described in this documentation, which was scribed in my presence. The recorded information has been reviewed and is accurate.     Tanna Furry, MD 07/26/16 [id number]    Tanna Furry, MD 07/27/16 [phone]

## 2016-07-26 NOTE — ED Triage Notes (Signed)
Pt was treated for scabies 9/18-rash was better-return of rash to arms and legs yesterday-pt using benadryl and calamine lotion-NAD-steady gait

## 2016-07-26 NOTE — Discharge Instructions (Signed)
Atarax as needed for itching.

## 2021-05-31 ENCOUNTER — Emergency Department (HOSPITAL_COMMUNITY): Payer: Medicare Other

## 2021-05-31 ENCOUNTER — Inpatient Hospital Stay (HOSPITAL_COMMUNITY)
Admission: EM | Admit: 2021-05-31 | Discharge: 2021-06-06 | DRG: 064 | Disposition: A | Discharge status: Home Health | Payer: Medicare Other | Attending: Internal Medicine | Admitting: Internal Medicine

## 2021-05-31 ENCOUNTER — Other Ambulatory Visit: Payer: Self-pay

## 2021-05-31 ENCOUNTER — Encounter (HOSPITAL_COMMUNITY): Payer: Self-pay | Admitting: Emergency Medicine

## 2021-05-31 DIAGNOSIS — I2693 Single subsegmental pulmonary embolism without acute cor pulmonale: Secondary | ICD-10-CM | POA: Diagnosis not present

## 2021-05-31 DIAGNOSIS — R4781 Slurred speech: Secondary | ICD-10-CM | POA: Diagnosis present

## 2021-05-31 DIAGNOSIS — R935 Abnormal findings on diagnostic imaging of other abdominal regions, including retroperitoneum: Secondary | ICD-10-CM | POA: Diagnosis not present

## 2021-05-31 DIAGNOSIS — K2980 Duodenitis without bleeding: Secondary | ICD-10-CM | POA: Diagnosis not present

## 2021-05-31 DIAGNOSIS — K2970 Gastritis, unspecified, without bleeding: Secondary | ICD-10-CM | POA: Diagnosis not present

## 2021-05-31 DIAGNOSIS — I6340 Cerebral infarction due to embolism of unspecified cerebral artery: Secondary | ICD-10-CM | POA: Diagnosis not present

## 2021-05-31 DIAGNOSIS — C787 Secondary malignant neoplasm of liver and intrahepatic bile duct: Secondary | ICD-10-CM | POA: Diagnosis present

## 2021-05-31 DIAGNOSIS — I723 Aneurysm of iliac artery: Secondary | ICD-10-CM | POA: Diagnosis present

## 2021-05-31 DIAGNOSIS — D530 Protein deficiency anemia: Secondary | ICD-10-CM | POA: Diagnosis not present

## 2021-05-31 DIAGNOSIS — Z79899 Other long term (current) drug therapy: Secondary | ICD-10-CM

## 2021-05-31 DIAGNOSIS — Z20822 Contact with and (suspected) exposure to covid-19: Secondary | ICD-10-CM | POA: Diagnosis present

## 2021-05-31 DIAGNOSIS — E7800 Pure hypercholesterolemia, unspecified: Secondary | ICD-10-CM | POA: Diagnosis present

## 2021-05-31 DIAGNOSIS — E43 Unspecified severe protein-calorie malnutrition: Secondary | ICD-10-CM | POA: Diagnosis present

## 2021-05-31 DIAGNOSIS — F0390 Unspecified dementia without behavioral disturbance: Secondary | ICD-10-CM | POA: Diagnosis present

## 2021-05-31 DIAGNOSIS — I480 Paroxysmal atrial fibrillation: Secondary | ICD-10-CM | POA: Diagnosis present

## 2021-05-31 DIAGNOSIS — Z7982 Long term (current) use of aspirin: Secondary | ICD-10-CM

## 2021-05-31 DIAGNOSIS — I739 Peripheral vascular disease, unspecified: Secondary | ICD-10-CM | POA: Diagnosis present

## 2021-05-31 DIAGNOSIS — R7989 Other specified abnormal findings of blood chemistry: Secondary | ICD-10-CM | POA: Diagnosis not present

## 2021-05-31 DIAGNOSIS — Z880 Allergy status to penicillin: Secondary | ICD-10-CM

## 2021-05-31 DIAGNOSIS — Z882 Allergy status to sulfonamides status: Secondary | ICD-10-CM

## 2021-05-31 DIAGNOSIS — Z7189 Other specified counseling: Secondary | ICD-10-CM

## 2021-05-31 DIAGNOSIS — I2694 Multiple subsegmental pulmonary emboli without acute cor pulmonale: Secondary | ICD-10-CM | POA: Diagnosis not present

## 2021-05-31 DIAGNOSIS — I272 Pulmonary hypertension, unspecified: Secondary | ICD-10-CM | POA: Diagnosis present

## 2021-05-31 DIAGNOSIS — A419 Sepsis, unspecified organism: Secondary | ICD-10-CM

## 2021-05-31 DIAGNOSIS — W19XXXA Unspecified fall, initial encounter: Secondary | ICD-10-CM

## 2021-05-31 DIAGNOSIS — F05 Delirium due to known physiological condition: Secondary | ICD-10-CM | POA: Diagnosis present

## 2021-05-31 DIAGNOSIS — R21 Rash and other nonspecific skin eruption: Secondary | ICD-10-CM | POA: Diagnosis present

## 2021-05-31 DIAGNOSIS — W07XXXA Fall from chair, initial encounter: Secondary | ICD-10-CM | POA: Diagnosis present

## 2021-05-31 DIAGNOSIS — D509 Iron deficiency anemia, unspecified: Secondary | ICD-10-CM | POA: Diagnosis present

## 2021-05-31 DIAGNOSIS — Z7951 Long term (current) use of inhaled steroids: Secondary | ICD-10-CM

## 2021-05-31 DIAGNOSIS — I10 Essential (primary) hypertension: Secondary | ICD-10-CM | POA: Diagnosis present

## 2021-05-31 DIAGNOSIS — R748 Abnormal levels of other serum enzymes: Secondary | ICD-10-CM | POA: Diagnosis not present

## 2021-05-31 DIAGNOSIS — I63549 Cerebral infarction due to unspecified occlusion or stenosis of unspecified cerebellar artery: Principal | ICD-10-CM | POA: Diagnosis present

## 2021-05-31 DIAGNOSIS — D6959 Other secondary thrombocytopenia: Secondary | ICD-10-CM | POA: Diagnosis present

## 2021-05-31 DIAGNOSIS — J449 Chronic obstructive pulmonary disease, unspecified: Secondary | ICD-10-CM | POA: Diagnosis present

## 2021-05-31 DIAGNOSIS — R4182 Altered mental status, unspecified: Secondary | ICD-10-CM | POA: Diagnosis present

## 2021-05-31 DIAGNOSIS — F17210 Nicotine dependence, cigarettes, uncomplicated: Secondary | ICD-10-CM | POA: Diagnosis present

## 2021-05-31 DIAGNOSIS — D6869 Other thrombophilia: Secondary | ICD-10-CM | POA: Diagnosis present

## 2021-05-31 DIAGNOSIS — R6510 Systemic inflammatory response syndrome (SIRS) of non-infectious origin without acute organ dysfunction: Secondary | ICD-10-CM | POA: Diagnosis present

## 2021-05-31 DIAGNOSIS — G9341 Metabolic encephalopathy: Secondary | ICD-10-CM | POA: Diagnosis present

## 2021-05-31 DIAGNOSIS — C801 Malignant (primary) neoplasm, unspecified: Secondary | ICD-10-CM | POA: Diagnosis present

## 2021-05-31 DIAGNOSIS — Z7952 Long term (current) use of systemic steroids: Secondary | ICD-10-CM

## 2021-05-31 DIAGNOSIS — Z8679 Personal history of other diseases of the circulatory system: Secondary | ICD-10-CM

## 2021-05-31 DIAGNOSIS — R1310 Dysphagia, unspecified: Secondary | ICD-10-CM | POA: Diagnosis not present

## 2021-05-31 DIAGNOSIS — I2699 Other pulmonary embolism without acute cor pulmonale: Secondary | ICD-10-CM | POA: Diagnosis not present

## 2021-05-31 DIAGNOSIS — D649 Anemia, unspecified: Secondary | ICD-10-CM

## 2021-05-31 DIAGNOSIS — R296 Repeated falls: Secondary | ICD-10-CM | POA: Diagnosis present

## 2021-05-31 DIAGNOSIS — Y92009 Unspecified place in unspecified non-institutional (private) residence as the place of occurrence of the external cause: Secondary | ICD-10-CM

## 2021-05-31 DIAGNOSIS — R29810 Facial weakness: Secondary | ICD-10-CM | POA: Diagnosis present

## 2021-05-31 DIAGNOSIS — S0081XA Abrasion of other part of head, initial encounter: Secondary | ICD-10-CM | POA: Diagnosis present

## 2021-05-31 DIAGNOSIS — K2990 Gastroduodenitis, unspecified, without bleeding: Secondary | ICD-10-CM | POA: Diagnosis present

## 2021-05-31 DIAGNOSIS — Z6820 Body mass index (BMI) 20.0-20.9, adult: Secondary | ICD-10-CM

## 2021-05-31 DIAGNOSIS — D630 Anemia in neoplastic disease: Secondary | ICD-10-CM | POA: Diagnosis present

## 2021-05-31 DIAGNOSIS — Z90710 Acquired absence of both cervix and uterus: Secondary | ICD-10-CM

## 2021-05-31 LAB — CBC WITH DIFFERENTIAL/PLATELET
Abs Immature Granulocytes: 0.07 10*3/uL (ref 0.00–0.07)
Basophils Absolute: 0.1 10*3/uL (ref 0.0–0.1)
Basophils Relative: 1 %
Eosinophils Absolute: 0.1 10*3/uL (ref 0.0–0.5)
Eosinophils Relative: 1 %
HCT: 31.2 % — ABNORMAL LOW (ref 36.0–46.0)
Hemoglobin: 10.4 g/dL — ABNORMAL LOW (ref 12.0–15.0)
Immature Granulocytes: 1 %
Lymphocytes Relative: 12 %
Lymphs Abs: 1.7 10*3/uL (ref 0.7–4.0)
MCH: 28.7 pg (ref 26.0–34.0)
MCHC: 33.3 g/dL (ref 30.0–36.0)
MCV: 86 fL (ref 80.0–100.0)
Monocytes Absolute: 2.2 10*3/uL — ABNORMAL HIGH (ref 0.1–1.0)
Monocytes Relative: 15 %
Neutro Abs: 10.3 10*3/uL — ABNORMAL HIGH (ref 1.7–7.7)
Neutrophils Relative %: 70 %
Platelets: 103 10*3/uL — ABNORMAL LOW (ref 150–400)
RBC: 3.63 MIL/uL — ABNORMAL LOW (ref 3.87–5.11)
RDW: 16.7 % — ABNORMAL HIGH (ref 11.5–15.5)
WBC: 14.4 10*3/uL — ABNORMAL HIGH (ref 4.0–10.5)
nRBC: 0 % (ref 0.0–0.2)

## 2021-05-31 LAB — COMPREHENSIVE METABOLIC PANEL
ALT: 45 U/L — ABNORMAL HIGH (ref 0–44)
AST: 133 U/L — ABNORMAL HIGH (ref 15–41)
Albumin: 2.6 g/dL — ABNORMAL LOW (ref 3.5–5.0)
Alkaline Phosphatase: 814 U/L — ABNORMAL HIGH (ref 38–126)
Anion gap: 14 (ref 5–15)
BUN: 21 mg/dL (ref 8–23)
CO2: 28 mmol/L (ref 22–32)
Calcium: 8.8 mg/dL — ABNORMAL LOW (ref 8.9–10.3)
Chloride: 92 mmol/L — ABNORMAL LOW (ref 98–111)
Creatinine, Ser: 0.71 mg/dL (ref 0.44–1.00)
GFR, Estimated: >60 mL/min (ref >60)
Glucose, Bld: 102 mg/dL — ABNORMAL HIGH (ref 70–99)
Potassium: 3.7 mmol/L (ref 3.5–5.1)
Sodium: 134 mmol/L — ABNORMAL LOW (ref 135–145)
Total Bilirubin: 2.5 mg/dL — ABNORMAL HIGH (ref 0.3–1.2)
Total Protein: 6.9 g/dL (ref 6.5–8.1)

## 2021-05-31 LAB — RESP PANEL BY RT-PCR (FLU A&B, COVID) ARPGX2
Influenza A by PCR: NEGATIVE
Influenza B by PCR: NEGATIVE
SARS Coronavirus 2 by RT PCR: NEGATIVE

## 2021-05-31 LAB — LACTIC ACID, PLASMA: Lactic Acid, Venous: 1.5 mmol/L (ref 0.5–1.9)

## 2021-05-31 LAB — APTT: aPTT: 38 seconds — ABNORMAL HIGH (ref 24–36)

## 2021-05-31 LAB — CK: Total CK: 81 U/L (ref 38–234)

## 2021-05-31 LAB — PROTIME-INR
INR: 1.5 — ABNORMAL HIGH (ref 0.8–1.2)
Prothrombin Time: 18 seconds — ABNORMAL HIGH (ref 11.4–15.2)

## 2021-05-31 MED ORDER — VANCOMYCIN HCL 750 MG/150ML IV SOLN
750.0000 mg | INTRAVENOUS | Status: DC
  Administered 2021-06-01 – 2021-06-04 (×4): 750 mg via INTRAVENOUS
  Filled 2021-05-31 (×6): qty 150

## 2021-05-31 MED ORDER — HEPARIN (PORCINE) 25000 UT/250ML-% IV SOLN
1300.0000 [IU]/h | INTRAVENOUS | Status: AC
  Administered 2021-05-31: 1000 [IU]/h via INTRAVENOUS
  Administered 2021-06-01: 1200 [IU]/h via INTRAVENOUS
  Filled 2021-05-31 (×3): qty 250

## 2021-05-31 MED ORDER — METRONIDAZOLE 500 MG/100ML IV SOLN
500.0000 mg | Freq: Three times a day (TID) | INTRAVENOUS | Status: DC
  Administered 2021-06-01 – 2021-06-05 (×13): 500 mg via INTRAVENOUS
  Filled 2021-05-31 (×14): qty 100

## 2021-05-31 MED ORDER — SODIUM CHLORIDE 0.9 % IV SOLN
2.0000 g | Freq: Once | INTRAVENOUS | Status: AC
  Administered 2021-05-31: 2 g via INTRAVENOUS
  Filled 2021-05-31: qty 2

## 2021-05-31 MED ORDER — ACETAMINOPHEN 325 MG PO TABS
650.0000 mg | ORAL_TABLET | Freq: Once | ORAL | Status: AC
  Administered 2021-05-31: 650 mg via ORAL
  Filled 2021-05-31: qty 2

## 2021-05-31 MED ORDER — SODIUM CHLORIDE 0.9 % IV SOLN
1.0000 g | Freq: Three times a day (TID) | INTRAVENOUS | Status: DC
  Administered 2021-06-01 – 2021-06-05 (×13): 1 g via INTRAVENOUS
  Filled 2021-05-31 (×17): qty 1

## 2021-05-31 MED ORDER — IOHEXOL 350 MG/ML SOLN
100.0000 mL | Freq: Once | INTRAVENOUS | Status: AC | PRN
  Administered 2021-05-31: 100 mL via INTRAVENOUS

## 2021-05-31 MED ORDER — SODIUM CHLORIDE 0.9 % IV BOLUS
1000.0000 mL | Freq: Once | INTRAVENOUS | Status: AC
  Administered 2021-05-31: 1000 mL via INTRAVENOUS

## 2021-05-31 MED ORDER — VANCOMYCIN HCL IN DEXTROSE 1-5 GM/200ML-% IV SOLN
1000.0000 mg | Freq: Once | INTRAVENOUS | Status: AC
  Administered 2021-06-01: 1000 mg via INTRAVENOUS
  Filled 2021-05-31: qty 200

## 2021-05-31 MED ORDER — IBUPROFEN 200 MG PO TABS
400.0000 mg | ORAL_TABLET | Freq: Four times a day (QID) | ORAL | Status: DC | PRN
  Administered 2021-06-01 – 2021-06-02 (×3): 400 mg via ORAL
  Filled 2021-05-31: qty 1
  Filled 2021-05-31 (×3): qty 2

## 2021-05-31 MED ORDER — FENTANYL CITRATE (PF) 100 MCG/2ML IJ SOLN
50.0000 ug | Freq: Once | INTRAMUSCULAR | Status: AC
  Administered 2021-05-31: 50 ug via INTRAVENOUS
  Filled 2021-05-31: qty 2

## 2021-05-31 MED ORDER — SODIUM CHLORIDE 0.9 % IV SOLN: INTRAVENOUS | Status: DC

## 2021-05-31 MED ORDER — HEPARIN BOLUS VIA INFUSION
4000.0000 [IU] | Freq: Once | INTRAVENOUS | Status: AC
  Administered 2021-05-31: 4000 [IU] via INTRAVENOUS
  Filled 2021-05-31: qty 4000

## 2021-05-31 NOTE — ED Provider Notes (Signed)
Emergency Medicine Provider Triage Evaluation Note  Anne West , a 75 y.o. female  was evaluated in triage.  Pt complains of confusion.  Daughter states that the past week, patient has been more confused.  She is been complaining of lower abdominal pain.  She has fallen multiple times.  Daughter reports slurred speech.  Patient will not eat or drink, having difficulty swallowing.  Review of Systems  Positive: Confusion, abd pain Negative: cp  Physical Exam  BP 124/70 (BP Location: Right Arm)   Pulse (!) 116   Temp (!) 100.4 F (38 C) (Oral)   Resp 14   SpO2 97%  Gen:   Alert to person.  Pt appears ill  Resp:  Normal effort  MSK:   Moves extremities without difficulty  Other:  Ttp of lower abd.   Medical Decision Making  Medically screening exam initiated at 5:19 PM.  Appropriate orders placed.  Anne West was informed that the remainder of the evaluation will be completed by another provider, this initial triage assessment does not replace that evaluation, and the importance of remaining in the ED until their evaluation is complete.  Sepsis labs ordered. Ct for confusion and falls   Anne Heidelberg, PA-C 05/31/21 1721    Anne Bo, MD 05/31/21 [phone]

## 2021-05-31 NOTE — Progress Notes (Signed)
Pharmacy Antibiotic Note  Anne West is a 75 y.o. female admitted on 05/31/2021 with sepsis.  Pharmacy has been consulted for vancomycin and aztreonam dosing.  Plan: Vancomycin '1000mg'$  x1 then '750mg'$  IV q24h (eAUC 516, Cr rounded up to 0.'8mg'$ /dL) Aztreonam 2g x1 then 1g IV q8h -Monitor renal function, clinical status, and antibiotic plan -Order vanc levels as necessary  Height: '5\' 7"'$  (170.2 cm) Weight: 59 kg (130 lb) IBW/kg (Calculated) : 61.6  Temp (24hrs), Avg:100.7 F (38.2 C), Min:100.4 F (38 C), Max:100.9 F (38.3 C)  Recent Labs  Lab 05/31/21 1711  WBC 14.4*  CREATININE 0.71  LATICACIDVEN 1.5    Estimated Creatinine Clearance: 54.9 mL/min (by C-G formula based on SCr of 0.71 mg/dL).    Allergies  Allergen Reactions   Donepezil Itching    SEVERE ITCHING   Penicillins Cross Reactors     Unknown    Sulfa Antibiotics     Antimicrobials this admission: 8/3 Vanc >>  8/3 Aztreonam >>  Flagyl q8h dosing per MD  Dose adjustments this admission: N/A  Microbiology results: 8/3 BCx:  8/3 UCx:    Thank you for allowing pharmacy to be a part of this patient's care.  Joetta Manners, PharmD, [facility] Emergency Medicine Clinical Pharmacist ED RPh Phone: [place]: [phone]

## 2021-05-31 NOTE — ED Provider Notes (Signed)
[place] EMERGENCY DEPARTMENT Provider Note   CSN: [id number] Arrival date & time: 05/31/21  1625     History Chief Complaint  Patient presents with   Altered Mental Status   multiple falls    BARBARA VEA is a 75 y.o. female hx of afib not on anticoagulation, COPD, HTN, here with altered mental status and multiple falls.  Patient is from home with family.  Patient was admitted to [facility] about 2 weeks ago and was diagnosed with liver mets with unclear primary.  Patient was sent home after the admission.  Patient continues to fall.  Patient apparently fell several days ago and was laying on the carpet since yesterday.  Patient also has some lower abdominal pains and has not been eating or drinking.  She is accompanied by her daughter.  Patient was noted to be febrile in triage but her daughter says she was not febrile today but appears to have low-grade temperature.  No known COVID exposures  The history is provided by the patient and a relative.      Past Medical History:  Diagnosis Date   A-fib [facility])    COPD (chronic obstructive pulmonary disease) (HCC)    High cholesterol    Hypertension     There are no problems to display for this patient.   Past Surgical History:  Procedure Laterality Date   ABDOMINAL HYSTERECTOMY     BACK SURGERY       OB History   No obstetric history on file.     History reviewed. No pertinent family history.  Social History   Tobacco Use   Smoking status: Every Day    Types: Cigarettes   Smokeless tobacco: Never  Substance Use Topics   Alcohol use: No   Drug use: No    Home Medications Prior to Admission medications   Medication Sig Start Date End Date Taking? Authorizing Provider  albuterol (PROVENTIL HFA;VENTOLIN HFA) 108 (90 BASE) MCG/ACT inhaler Inhale 2 puffs into the lungs every 6 (six) hours as needed for wheezing.    [provider]  aspirin 81 MG chewable tablet Chew 81 mg  by mouth daily.    [provider]  atorvastatin (LIPITOR) 40 MG tablet Take 40 mg by mouth daily.    [provider]  fluticasone-salmeterol (ADVAIR HFA) 230-21 MCG/ACT inhaler Inhale 2 puffs into the lungs 2 (two) times daily.    [provider]  guaifenesin (TUSSIN) 100 MG/5ML syrup Take 200 mg by mouth 3 (three) times daily as needed. For congestion     [provider]  hydrOXYzine (ATARAX/VISTARIL) 25 MG tablet Take 1 tablet (25 mg total) by mouth every 6 (six) hours as needed for itching. 07/26/16   Tanna Furry, MD  ibuprofen (ADVIL,MOTRIN) 200 MG tablet Take 400 mg by mouth every 6 (six) hours as needed. For fever and pain     [provider]  lisinopril (PRINIVIL,ZESTRIL) 20 MG tablet Take 20 mg by mouth daily.    [provider]  montelukast (SINGULAIR) 10 MG tablet Take 10 mg by mouth at bedtime.    [provider]  Phenylephrine-DM-GG-APAP (Fallis FAST-MAX COLD FLU) 5-10-200-325 MG/10ML LIQD Take 20 mLs by mouth every 6 (six) hours as needed. For congestion     [provider]  predniSONE (DELTASONE) 20 MG tablet Take 1 tablet (20 mg total) by mouth 2 (two) times daily with a meal. 07/26/16   Tanna Furry, MD  vitamin C (ASCORBIC  ACID) 500 MG tablet Take 500 mg by mouth daily.      [provider]    Allergies    Penicillins cross reactors and Sulfa antibiotics  Review of Systems   Review of Systems  Constitutional:  Positive for fever.  Neurological:  Positive for weakness.  All other systems reviewed and are negative.  Physical Exam Updated Vital Signs BP 128/70   Pulse (!) 101   Temp (!) 100.9 F (38.3 C) (Oral)   Resp 12   SpO2 92%   Physical Exam Vitals and nursing note reviewed.  HENT:     Head: Normocephalic.     Comments: Vesicular rash in the left upper face.  It has crusted over already    Nose: Nose normal.     Mouth/Throat:     Mouth: Mucous membranes are dry.  Eyes:      Extraocular Movements: Extraocular movements intact.     Pupils: Pupils are equal, round, and reactive to light.  Cardiovascular:     Rate and Rhythm: Normal rate and regular rhythm.     Pulses: Normal pulses.     Heart sounds: Normal heart sounds.  Pulmonary:     Effort: Pulmonary effort is normal.     Breath sounds: Normal breath sounds.  Abdominal:     General: Abdomen is flat.     Palpations: Abdomen is soft.  Musculoskeletal:        General: Normal range of motion.     Cervical back: Normal range of motion and neck supple.  Skin:    General: Skin is warm.     Capillary Refill: Capillary refill takes less than 2 seconds.  Neurological:     Mental Status: She is alert.     Comments: Altered and confused and moving all extremities  Psychiatric:     Comments: Unable     ED Results / Procedures / Treatments   Labs (all labs ordered are listed, but only abnormal results are displayed) Labs Reviewed  COMPREHENSIVE METABOLIC PANEL - Abnormal; Notable for the following components:      Result Value   Sodium 134 (*)    Chloride 92 (*)    Glucose, Bld 102 (*)    Calcium 8.8 (*)    Albumin 2.6 (*)    AST 133 (*)    ALT 45 (*)    Alkaline Phosphatase 814 (*)    Total Bilirubin 2.5 (*)    All other components within normal limits  CBC WITH DIFFERENTIAL/PLATELET - Abnormal; Notable for the following components:   WBC 14.4 (*)    RBC 3.63 (*)    Hemoglobin 10.4 (*)    HCT 31.2 (*)    RDW 16.7 (*)    Platelets 103 (*)    Neutro Abs 10.3 (*)    Monocytes Absolute 2.2 (*)    All other components within normal limits  PROTIME-INR - Abnormal; Notable for the following components:   Prothrombin Time 18.0 (*)    INR 1.5 (*)    All other components within normal limits  APTT - Abnormal; Notable for the following components:   aPTT 38 (*)    All other components within normal limits  RESP PANEL BY RT-PCR (FLU A&B, COVID) ARPGX2  CULTURE, BLOOD (ROUTINE X 2)  CULTURE, BLOOD  (ROUTINE X 2)  URINE CULTURE  LACTIC ACID, PLASMA  CK  URINALYSIS, ROUTINE W REFLEX MICROSCOPIC  PATHOLOGIST SMEAR REVIEW    EKG None  Radiology DG Chest 1  View  Result Date: 05/31/2021 CLINICAL DATA:  Multiple falls, slurred speech, abdominal pain EXAM: CHEST  1 VIEW COMPARISON:  05/16/2021 FINDINGS: Single frontal view of the chest demonstrates an unremarkable cardiac silhouette. No acute airspace disease, effusion, or pneumothorax. Chronic interstitial scarring is noted. No acute bony abnormalities. Partial visualization of aortic stent. IMPRESSION: 1. No acute intrathoracic process. Electronically Signed   By: Randa Ngo M.D.   On: 05/31/2021 18:28   CT HEAD WO CONTRAST (5MM)  Result Date: 05/31/2021 CLINICAL DATA:  Mental status change, unknown cause; Neck trauma (Age >= 65y); Facial trauma. Increased confusion, slurred speech and multiple falls over the past week. EXAM: CT HEAD WITHOUT CONTRAST CT MAXILLOFACIAL WITHOUT CONTRAST CT CERVICAL SPINE WITHOUT CONTRAST TECHNIQUE: Multidetector CT imaging of the head, cervical spine, and maxillofacial structures were performed using the standard protocol without intravenous contrast. Multiplanar CT image reconstructions of the cervical spine and maxillofacial structures were also generated. COMPARISON:  None. FINDINGS: CT HEAD FINDINGS Brain: Normal anatomic configuration. Parenchymal volume loss is commensurate with the patient's age. Moderate periventricular white matter changes are present likely reflecting the sequela of small vessel ischemia. Remote lacunar infarct versus dilated perivascular space within the right basal ganglia. No abnormal intra or extra-axial mass lesion or fluid collection. No abnormal mass effect or midline shift. No evidence of acute intracranial hemorrhage or infarct. Ventricular size is normal. Cerebellum unremarkable. Vascular: No asymmetric hyperdense vasculature at the skull base. Skull: Intact Other: Mastoid air  cells and middle ear cavities are clear. CT MAXILLOFACIAL FINDINGS Osseous: Mild motion artifact noted. No acute facial fracture. No mandibular dislocation. Orbits: Negative. No traumatic or inflammatory finding. Sinuses: Clear. Soft tissues: Negative. CT CERVICAL SPINE FINDINGS Alignment: Normal.  No listhesis. Skull base and vertebrae: Craniocervical alignment is normal. The atlantodental interval is not widened. There is no acute fracture of the cervical spine. 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 No canal hematoma. The spinal canal is otherwise widely patent. No prevertebral soft tissue swelling. No pathologic adenopathy within the neck. No paraspinal fluid collections identified. Disc levels: There is mild intervertebral disc calcifications and posterior disc bulges noted at C3-C7 in keeping with changes of mild degenerative disc disease. Intervertebral disc height is preserved. Vertebral body height is preserved. The prevertebral soft tissues are not thickened on sagittal reformats. 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 Upper chest: Unremarkable Other: None IMPRESSION: No acute intracranial injury.  No calvarial fracture. No acute facial fracture. No acute fracture or listhesis the cervical spine. Electronically Signed   By: Fidela Salisbury MD   On: 05/31/2021 21:03   CT Angio Chest PE W and/or Wo Contrast  Result Date: 05/31/2021 CLINICAL DATA:  Abdominal pain, elevated LFTs and encephalopathy EXAM: CT ANGIOGRAPHY CHEST CT ABDOMEN AND PELVIS WITH CONTRAST TECHNIQUE: Multidetector CT imaging of the chest was performed using the standard protocol during bolus administration of intravenous contrast. Multiplanar CT image reconstructions and MIPs were obtained to evaluate  the vascular anatomy. Multidetector CT imaging of the abdomen and pelvis was performed using the standard protocol during bolus administration of intravenous contrast. CONTRAST:  146m OMNIPAQUE IOHEXOL 350 MG/ML SOLN COMPARISON:  None. FINDINGS: CTA CHEST FINDINGS Cardiovascular: Contrast injection is sufficient to demonstrate satisfactory opacification of the pulmonary arteries to the segmental level.There are small segmental pulmonary emboli within  the right upper, middle and lower lobes. On the left, there is a segmental embolus in the left upper lobe. The main pulmonary artery is enlarged, measuring 4.1 cm. There is no CT evidence of acute right heart strain. There is moderate aortic atherosclerosis. There is a normal 3-vessel arch branching pattern. Heart size is normal. No pericardial effusion. There are coronary artery calcifications. Mediastinum/Nodes: No mediastinal, hilar or axillary lymphadenopathy. The visualized thyroid and thoracic esophageal course are unremarkable. Lungs/Pleura: No pulmonary nodules or masses. No pleural effusion or pneumothorax. No focal airspace consolidation. No focal pleural abnormality. Musculoskeletal: No chest wall abnormality. No acute or significant osseous findings. Review of the MIP images confirms the above findings. CT ABDOMEN and PELVIS FINDINGS Hepatobiliary: Innumerable lesions throughout the liver. The largest lesion is in the right hepatic lobe and measures approximately 5.7 cm. Mild gallbladder wall thickening Pancreas: Pancreatic duct is dilated in the distal body and tail. The tail is atrophic. Spleen: Normal. Adrenals/Urinary Tract: --Adrenal glands: Normal. --Right kidney/ureter: No hydronephrosis or perinephric stranding. No nephrolithiasis. No obstructing ureteral stones. --Left kidney/ureter: No hydronephrosis or perinephric stranding. No nephrolithiasis. No obstructing ureteral stones. --Urinary bladder: Unremarkable. Stomach/Bowel: --Stomach/Duodenum: No  hiatal hernia or other gastric abnormality. Normal duodenal course and caliber. --Small bowel: No dilatation or inflammation. --Colon: No focal abnormality. --Appendix: Not visualized. No right lower quadrant inflammation or free fluid. Vascular/Lymphatic: Aortic atherosclerosis. Aorta bi-iliac stent is patent. 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 No abdominal or pelvic lymphadenopathy. Reproductive: Status post hysterectomy. No adnexal mass. Small volume free fluid in the pelvis. Musculoskeletal. No bony spinal canal stenosis or focal osseous abnormality. Other: None. IMPRESSION: 1. Small segmental pulmonary emboli within the right upper, middle and lower lobes and left upper lobe. No CT evidence of acute right heart strain. 2. Innumerable liver lesions, consistent with metastatic disease. 3. Dilated main pulmonary artery, consistent with pulmonary hypertension. 4. 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 Critical Value/emergent results were called by telephone at the time of interpretation on 05/31/2021 at 9:10 pm to provider Reagan Behlke , who verbally acknowledged these results. Aortic Atherosclerosis (ICD10-I70.0). Electronically Signed   By: Ulyses Jarred M.D.   On: 05/31/2021 21:11   CT Cervical Spine Wo Contrast  Result Date: 05/31/2021 CLINICAL DATA:  Mental status change, unknown cause; Neck trauma (Age >= 65y); Facial trauma. Increased confusion, slurred speech and multiple falls over the past week. EXAM: CT HEAD WITHOUT CONTRAST CT MAXILLOFACIAL WITHOUT CONTRAST CT CERVICAL SPINE WITHOUT CONTRAST TECHNIQUE: Multidetector CT imaging of the head, cervical spine, and maxillofacial structures were performed using the standard protocol without intravenous contrast. Multiplanar CT image reconstructions of the cervical spine and  maxillofacial structures were also generated. COMPARISON:  None. FINDINGS: CT HEAD FINDINGS Brain: Normal anatomic configuration. Parenchymal volume loss is commensurate with the patient's age. Moderate periventricular white matter changes are present likely reflecting the sequela of small vessel ischemia. Remote lacunar infarct versus dilated perivascular space within the right basal ganglia. No abnormal intra or extra-axial mass lesion or fluid collection. No abnormal mass effect or midline shift. No evidence of acute intracranial hemorrhage or infarct. Ventricular size is normal. Cerebellum unremarkable. Vascular: No asymmetric hyperdense vasculature at the skull base. Skull: Intact Other: Mastoid air cells and middle ear cavities are clear. CT MAXILLOFACIAL FINDINGS Osseous: Mild motion artifact noted. No acute facial fracture. No  mandibular dislocation. Orbits: Negative. No traumatic or inflammatory finding. Sinuses: Clear. Soft tissues: Negative. CT CERVICAL SPINE FINDINGS Alignment: Normal.  No listhesis. Skull base and vertebrae: Craniocervical alignment is normal. The atlantodental interval is not widened. There is no acute fracture of the cervical spine. 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 No canal hematoma. The spinal canal is otherwise widely patent. No prevertebral soft tissue swelling. No pathologic adenopathy within the neck. No paraspinal fluid collections identified. Disc levels: There is mild intervertebral disc calcifications and posterior disc bulges noted at C3-C7 in keeping with changes of mild degenerative disc disease. Intervertebral disc height is preserved. Vertebral body height is preserved. The prevertebral soft tissues are not thickened on sagittal reformats. Review of the axial images demonstrates mild facet and uncovertebral arthrosis at C4-5 and C5-6  without significant associated neuroforaminal narrowing. Upper chest: Unremarkable Other: None IMPRESSION: No acute intracranial injury.  No calvarial fracture. No acute facial fracture. No acute fracture or listhesis the cervical spine. Electronically Signed   By: Fidela Salisbury MD   On: 05/31/2021 21:03   CT ABDOMEN PELVIS W CONTRAST  Result Date: 05/31/2021 CLINICAL DATA:  Abdominal pain, elevated LFTs and encephalopathy EXAM: CT ANGIOGRAPHY CHEST CT ABDOMEN AND PELVIS WITH CONTRAST TECHNIQUE: Multidetector CT imaging of the chest was performed using the standard protocol during bolus administration of intravenous contrast. Multiplanar CT image reconstructions and MIPs were obtained to evaluate the vascular anatomy. Multidetector CT imaging of the abdomen and pelvis was performed using the standard protocol during bolus administration of intravenous contrast. CONTRAST:  145m OMNIPAQUE IOHEXOL 350 MG/ML SOLN COMPARISON:  None. FINDINGS: CTA CHEST FINDINGS Cardiovascular: Contrast injection is sufficient to demonstrate satisfactory opacification of the pulmonary arteries to the segmental level.There are small segmental pulmonary emboli within the right upper, middle and lower lobes. On the left, there is a segmental embolus in the left upper lobe. The main pulmonary artery is enlarged, measuring 4.1 cm. There is no CT evidence of acute right heart strain. There is moderate aortic atherosclerosis. There is a normal 3-vessel arch branching pattern. Heart size is normal. No pericardial effusion. There are coronary artery calcifications. Mediastinum/Nodes: No mediastinal, hilar or axillary lymphadenopathy. The visualized thyroid and thoracic esophageal course are unremarkable. Lungs/Pleura: No pulmonary nodules or masses. No pleural effusion or pneumothorax. No focal airspace consolidation. No focal pleural abnormality. Musculoskeletal: No chest wall abnormality. No acute or significant osseous findings. Review of  the MIP images confirms the above findings. CT ABDOMEN and PELVIS FINDINGS Hepatobiliary: Innumerable lesions throughout the liver. The largest lesion is in the right hepatic lobe and measures approximately 5.7 cm. Mild gallbladder wall thickening Pancreas: Pancreatic duct is dilated in the distal body and tail. The tail is atrophic. Spleen: Normal. Adrenals/Urinary Tract: --Adrenal glands: Normal. --Right kidney/ureter: No hydronephrosis or perinephric stranding. No nephrolithiasis. No obstructing ureteral stones. --Left kidney/ureter: No hydronephrosis or perinephric stranding. No nephrolithiasis. No obstructing ureteral stones. --Urinary bladder: Unremarkable. Stomach/Bowel: --Stomach/Duodenum: No hiatal hernia or other gastric abnormality. Normal duodenal course and caliber. --Small bowel: No dilatation or inflammation. --Colon: No focal abnormality. --Appendix: Not visualized. No right lower quadrant inflammation or free fluid. Vascular/Lymphatic: Aortic atherosclerosis. Aorta bi-iliac stent is patent. 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 No abdominal or pelvic lymphadenopathy. Reproductive: Status  post hysterectomy. No adnexal mass. Small volume free fluid in the pelvis. Musculoskeletal. No bony spinal canal stenosis or focal osseous abnormality. Other: None. IMPRESSION: 1. Small segmental pulmonary emboli within the right upper, middle and lower lobes and left upper lobe. No CT evidence of acute right heart strain. 2. Innumerable liver lesions, consistent with metastatic disease. 3. Dilated main pulmonary artery, consistent with pulmonary hypertension. 4. 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 Critical Value/emergent results were called by telephone at the time of interpretation on 05/31/2021 at 9:10 pm to provider Jaramie Bastos , who  verbally acknowledged these results. Aortic Atherosclerosis (ICD10-I70.0). Electronically Signed   By: Ulyses Jarred M.D.   On: 05/31/2021 21:11   CT Maxillofacial Wo Contrast  Result Date: 05/31/2021 CLINICAL DATA:  Mental status change, unknown cause; Neck trauma (Age >= 65y); Facial trauma. Increased confusion, slurred speech and multiple falls over the past week. EXAM: CT HEAD WITHOUT CONTRAST CT MAXILLOFACIAL WITHOUT CONTRAST CT CERVICAL SPINE WITHOUT CONTRAST TECHNIQUE: Multidetector CT imaging of the head, cervical spine, and maxillofacial structures were performed using the standard protocol without intravenous contrast. Multiplanar CT image reconstructions of the cervical spine and maxillofacial structures were also generated. COMPARISON:  None. FINDINGS: CT HEAD FINDINGS Brain: Normal anatomic configuration. Parenchymal volume loss is commensurate with the patient's age. Moderate periventricular white matter changes are present likely reflecting the sequela of small vessel ischemia. Remote lacunar infarct versus dilated perivascular space within the right basal ganglia. No abnormal intra or extra-axial mass lesion or fluid collection. No abnormal mass effect or midline shift. No evidence of acute intracranial hemorrhage or infarct. Ventricular size is normal. Cerebellum unremarkable. Vascular: No asymmetric hyperdense vasculature at the skull base. Skull: Intact Other: Mastoid air cells and middle ear cavities are clear. CT MAXILLOFACIAL FINDINGS Osseous: Mild motion artifact noted. No acute facial fracture. No mandibular dislocation. Orbits: Negative. No traumatic or inflammatory finding. Sinuses: Clear. Soft tissues: Negative. CT CERVICAL SPINE FINDINGS Alignment: Normal.  No listhesis. Skull base and vertebrae: Craniocervical alignment is normal. The atlantodental interval is not widened. There is no acute fracture of the cervical spine. Soft tissues and spinal canal: There is ossification of the  posterior longitudinal ligament at C3-C5 which abuts and slightly remodels the thecal sac at these levels. No canal hematoma. The spinal canal is otherwise widely patent. No prevertebral soft tissue swelling. No pathologic adenopathy within the neck. No paraspinal fluid collections identified. Disc levels: There is mild intervertebral disc calcifications and posterior disc bulges noted at C3-C7 in keeping with changes of mild degenerative disc disease. Intervertebral disc height is preserved. Vertebral body height is preserved. The prevertebral soft tissues are not thickened on sagittal reformats. 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 Upper chest: Unremarkable Other: None IMPRESSION: No acute intracranial injury.  No calvarial fracture. No acute facial fracture. No acute fracture or listhesis the cervical spine. Electronically Signed   By: Fidela Salisbury MD   On: 05/31/2021 21:03    Procedures Procedures   CRITICAL CARE Performed by: Wandra Arthurs   Total critical care time: 30 minutes  Critical care time was exclusive of separately billable procedures and treating other patients.  Critical care was necessary to treat or prevent imminent or life-threatening deterioration.  Critical care was time spent personally by me on the following activities: development of treatment plan with patient and/or surrogate as well as nursing, discussions  with consultants, evaluation of patient's response to treatment, examination of patient, obtaining history from patient or surrogate, ordering and performing treatments and interventions, ordering and review of laboratory studies, ordering and review of radiographic studies, pulse oximetry and re-evaluation of patient's condition.   Medications Ordered in ED Medications  fentaNYL (SUBLIMAZE) injection 50 mcg (has no administration in time range)   sodium chloride 0.9 % bolus 1,000 mL (1,000 mLs Intravenous New Bag/Given 05/31/21 1940)  acetaminophen (TYLENOL) tablet 650 mg (650 mg Oral Given 05/31/21 1942)  iohexol (OMNIPAQUE) 350 MG/ML injection 100 mL (100 mLs Intravenous Contrast Given 05/31/21 2018)    ED Course  I have reviewed the triage vital signs and the nursing notes.  Pertinent labs & imaging results that were available during my care of the patient were reviewed by me and considered in my medical decision making (see chart for details).    MDM Rules/Calculators/A&P                          RAEYA APOLLO is a 75 y.o. female here presenting with altered mental status and falls.  I reviewed records from 2 weeks ago at [facility].  Patient apparently has a possible liver mets from an unknown primary cancer.  Patient is febrile and tachycardic.  Consider PE from mets versus sepsis from UTI or pneumonia. Patient does have a rash on the left face.  Family does not think it was shingles and it was just from laying on the carpet and it was a carpet rash.  Plan to get sepsis work-up and do trauma scan including CTA chest and CT abdomen pelvis.  9:34 PM CT showed multiple subsegmental PEs.  Patient is started on heparin.  Patient's oxygen is 91 to 92% on room air.  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  Hospitalist to admit for PEs    Final Clinical Impression(s) / ED Diagnoses Final diagnoses:  AMS (altered mental status)    Rx / DC Orders ED Discharge Orders     None        Drenda Freeze, MD 05/31/21 2136

## 2021-05-31 NOTE — H&P (Addendum)
History and Physical    CHAQUETTA SCHLOTTMAN GQQ:[id number] DOB: [birth year] DOA: 05/31/2021  PCP: Default, Provider, MD Patient coming from: Home  Chief Complaint: Altered mental status  HPI: Anne West is a 75 y.o. female with medical history significant of dementia, AAA repair, A. fib not on anticoagulation, COPD, hypertension, hyperlipidemia brought to the ED by her daughter for evaluation of confusion, slurred speech, lower abdominal pain, multiple falls, not eating or drinking and having difficulty swallowing.  Patient was admitted to [facility] about 2 weeks ago for abdominal pain secondary to constipation in the setting of opiate use, poor oral intake, and significant weight loss.  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    In the ED, febrile with temperature 100.4 F on arrival and tachycardic with heart rate in the 110s.  Not hypotensive.  Not hypoxic.  Labs showing WBC 14.4, hemoglobin 10.4, MCV 86.0, platelet count 103.  Sodium 134, potassium 3.7, chloride 92, bicarb 28, BUN 21, creatinine 0.7, glucose 102.  LFTs elevated: AST 133, ALT 145, alk phos 814, T bili 2.5.  Lactic acid 1.5.  Blood cultures x2 pending.  UA and urine culture pending.  INR 1.5.  CK 81.  COVID and influenza PCR negative.  Chest x-ray showing no acute intrathoracic process.  CT head/ maxillofacial/ C-spine negative for acute finding.  CT angiogram chest showing small segmental pulmonary emboli within the right upper, middle, and lower lobes and left upper lobe.  No CT evidence of acute right heart strain.  CT abdomen pelvis showing innumerable liver lesions consistent with metastatic disease. Patient was given Tylenol, fentanyl, and 1 L normal saline bolus.  Started on IV heparin for anticoagulation.  Tachycardia improved after fluid bolus.   Patient is confused.  History provided by daughter at bedside who states that the patient has complained of lower abdominal pain for  several weeks.  She has also lost a significant amount of weight and is not eating.  She is having difficulty swallowing.  Daughter states patient was admitted to [facility] where they diagnosed her with liver lesions concerning for cancer.  States patient has dementia but this past week she has been increasingly more confused and has not been picking up her phone.  She has had 2 falls and during one of the falls she fell on the carpet which left her with a rash on her face.  Daughter is concerned that the patient's speech has been slurred this past week.  No fevers at home.  No vomiting or diarrhea.  No shortness of breath reported.  Review of Systems:  All systems reviewed and apart from history of presenting illness, are negative.  Past Medical History:  Diagnosis Date   A-fib [facility])    COPD (chronic obstructive pulmonary disease) (HCC)    High cholesterol    Hypertension     Past Surgical History:  Procedure Laterality Date   ABDOMINAL HYSTERECTOMY     BACK SURGERY       reports that she has been smoking cigarettes. She has never used smokeless tobacco. She reports that she does not drink alcohol and does not use drugs.  Allergies  Allergen Reactions   Penicillins Cross Reactors     Unknown    Sulfa Antibiotics     History reviewed. No pertinent family history.  Prior to Admission medications   Medication Sig Start Date End Date Taking? Authorizing Provider  albuterol (PROVENTIL HFA;VENTOLIN HFA) 108 (90  BASE) MCG/ACT inhaler Inhale 2 puffs into the lungs every 6 (six) hours as needed for wheezing.    [provider]  aspirin 81 MG chewable tablet Chew 81 mg by mouth daily.    [provider]  atorvastatin (LIPITOR) 40 MG tablet Take 40 mg by mouth daily.    [provider]  fluticasone-salmeterol (ADVAIR HFA) 230-21 MCG/ACT inhaler Inhale 2 puffs into the lungs 2 (two) times daily.    [provider]  guaifenesin (TUSSIN) 100  MG/5ML syrup Take 200 mg by mouth 3 (three) times daily as needed. For congestion     [provider]  hydrOXYzine (ATARAX/VISTARIL) 25 MG tablet Take 1 tablet (25 mg total) by mouth every 6 (six) hours as needed for itching. 07/26/16   Tanna Furry, MD  ibuprofen (ADVIL,MOTRIN) 200 MG tablet Take 400 mg by mouth every 6 (six) hours as needed. For fever and pain     [provider]  lisinopril (PRINIVIL,ZESTRIL) 20 MG tablet Take 20 mg by mouth daily.    [provider]  montelukast (SINGULAIR) 10 MG tablet Take 10 mg by mouth at bedtime.    [provider]  Phenylephrine-DM-GG-APAP (Hines FAST-MAX COLD FLU) 5-10-200-325 MG/10ML LIQD Take 20 mLs by mouth every 6 (six) hours as needed. For congestion     [provider]  predniSONE (DELTASONE) 20 MG tablet Take 1 tablet (20 mg total) by mouth 2 (two) times daily with a meal. 07/26/16   Tanna Furry, MD  vitamin C (ASCORBIC ACID) 500 MG tablet Take 500 mg by mouth daily.      [provider]    Physical Exam: Vitals:   05/31/21 2145 05/31/21 2200 05/31/21 2215 05/31/21 2230  BP: 133/70 117/65 133/68 125/66  Pulse: (!) 101 100 (!) 102 100  Resp: 14 (!) 23 14 14   Temp:      TempSrc:      SpO2: 93% 93% 93% 92%  Weight:      Height:        Physical Exam Constitutional:      General: She is not in acute distress. HENT:     Head: Normocephalic and atraumatic.     Mouth/Throat:     Mouth: Mucous membranes are dry.  Eyes:     Extraocular Movements: Extraocular movements intact.     Conjunctiva/sclera: Conjunctivae normal.  Cardiovascular:     Rate and Rhythm: Normal rate and regular rhythm.     Pulses: Normal pulses.  Pulmonary:     Effort: Pulmonary effort is normal. No respiratory distress.     Breath sounds: Normal breath sounds. No wheezing or rales.  Abdominal:     General: Bowel sounds are normal. There is no distension.     Palpations: Abdomen is soft.     Tenderness: There is  no abdominal tenderness. There is no guarding or rebound.  Musculoskeletal:        General: No swelling or tenderness.     Cervical back: Normal range of motion and neck supple.  Skin:    General: Skin is warm and dry.  Neurological:     Mental Status: She is alert.     Cranial Nerves: No cranial nerve deficit.     Sensory: No sensory deficit.     Motor: No weakness.     Comments: Oriented to self only Following commands appropriately     Labs on Admission: I have personally reviewed following labs and imaging studies  CBC: Recent Labs  Lab 05/31/21 1711  WBC 14.4*  NEUTROABS 10.3*  HGB 10.4*  HCT 31.2*  MCV 86.0  PLT 762*   Basic Metabolic Panel: Recent Labs  Lab 05/31/21 1711  NA 134*  K 3.7  CL 92*  CO2 28  GLUCOSE 102*  BUN 21  CREATININE 0.71  CALCIUM 8.8*   GFR: Estimated Creatinine Clearance: 54.9 mL/min (by C-G formula based on SCr of 0.71 mg/dL). Liver Function Tests: Recent Labs  Lab 05/31/21 1711  AST 133*  ALT 45*  ALKPHOS 814*  BILITOT 2.5*  PROT 6.9  ALBUMIN 2.6*   No results for input(s): LIPASE, AMYLASE in the last 168 hours. No results for input(s): AMMONIA in the last 168 hours. Coagulation Profile: Recent Labs  Lab 05/31/21 1711  INR 1.5*   Cardiac Enzymes: Recent Labs  Lab 05/31/21 1803  CKTOTAL 81   BNP (last 3 results) No results for input(s): PROBNP in the last 8760 hours. HbA1C: No results for input(s): HGBA1C in the last 72 hours. CBG: No results for input(s): GLUCAP in the last 168 hours. Lipid Profile: No results for input(s): CHOL, HDL, LDLCALC, TRIG, CHOLHDL, LDLDIRECT in the last 72 hours. Thyroid Function Tests: No results for input(s): TSH, T4TOTAL, FREET4, T3FREE, THYROIDAB in the last 72 hours. Anemia Panel: No results for input(s): VITAMINB12, FOLATE, FERRITIN, TIBC, IRON, RETICCTPCT in the last 72 hours. Urine analysis:    Component Value Date/Time   COLORURINE AMBER (A) 09/17/2011 1948    APPEARANCEUR CLEAR 09/17/2011 1948   LABSPEC 1.029 09/17/2011 1948   PHURINE 6.0 09/17/2011 1948   GLUCOSEU NEGATIVE 09/17/2011 1948   HGBUR NEGATIVE 09/17/2011 1948   BILIRUBINUR NEGATIVE 09/17/2011 1948   KETONESUR 15 (A) 09/17/2011 1948   PROTEINUR 30 (A) 09/17/2011 1948   UROBILINOGEN 1.0 09/17/2011 1948   NITRITE NEGATIVE 09/17/2011 1948   LEUKOCYTESUR NEGATIVE 09/17/2011 1948    Radiological Exams on Admission: DG Chest 1 View  Result Date: 05/31/2021 CLINICAL DATA:  Multiple falls, slurred speech, abdominal pain EXAM: CHEST  1 VIEW COMPARISON:  05/16/2021 FINDINGS: Single frontal view of the chest demonstrates an unremarkable cardiac silhouette. No acute airspace disease, effusion, or pneumothorax. Chronic interstitial scarring is noted. No acute bony abnormalities. Partial visualization of aortic stent. IMPRESSION: 1. No acute intrathoracic process. Electronically Signed   By: Randa Ngo M.D.   On: 05/31/2021 18:28   CT HEAD WO CONTRAST (5MM)  Result Date: 05/31/2021 CLINICAL DATA:  Mental status change, unknown cause; Neck trauma (Age >= 65y); Facial trauma. Increased confusion, slurred speech and multiple falls over the past week. EXAM: CT HEAD WITHOUT CONTRAST CT MAXILLOFACIAL WITHOUT CONTRAST CT CERVICAL SPINE WITHOUT CONTRAST TECHNIQUE: Multidetector CT imaging of the head, cervical spine, and maxillofacial structures were performed using the standard protocol without intravenous contrast. Multiplanar CT image reconstructions of the cervical spine and maxillofacial structures were also generated. COMPARISON:  None. FINDINGS: CT HEAD FINDINGS Brain: Normal anatomic configuration. Parenchymal volume loss is commensurate with the patient's age. Moderate periventricular white matter changes are present likely reflecting the sequela of small vessel ischemia. Remote lacunar infarct versus dilated perivascular space within the right basal ganglia. No abnormal intra or extra-axial mass  lesion or fluid collection. No abnormal mass effect or midline shift. No evidence of acute intracranial hemorrhage or infarct. Ventricular size is normal. Cerebellum unremarkable. Vascular: No asymmetric hyperdense vasculature at the skull base. Skull: Intact Other: Mastoid air cells and middle ear cavities are clear. CT MAXILLOFACIAL FINDINGS Osseous: Mild motion artifact noted. No  acute facial fracture. No mandibular dislocation. Orbits: Negative. No traumatic or inflammatory finding. Sinuses: Clear. Soft tissues: Negative. CT CERVICAL SPINE FINDINGS Alignment: Normal.  No listhesis. Skull base and vertebrae: Craniocervical alignment is normal. The atlantodental interval is not widened. There is no acute fracture of the cervical spine. 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 No canal hematoma. The spinal canal is otherwise widely patent. No prevertebral soft tissue swelling. No pathologic adenopathy within the neck. No paraspinal fluid collections identified. Disc levels: There is mild intervertebral disc calcifications and posterior disc bulges noted at C3-C7 in keeping with changes of mild degenerative disc disease. Intervertebral disc height is preserved. Vertebral body height is preserved. The prevertebral soft tissues are not thickened on sagittal reformats. 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 Upper chest: Unremarkable Other: None IMPRESSION: No acute intracranial injury.  No calvarial fracture. No acute facial fracture. No acute fracture or listhesis the cervical spine. Electronically Signed   By: Fidela Salisbury MD   On: 05/31/2021 21:03   CT Angio Chest PE W and/or Wo Contrast  Result Date: 05/31/2021 CLINICAL DATA:  Abdominal pain, elevated LFTs and encephalopathy  EXAM: CT ANGIOGRAPHY CHEST CT ABDOMEN AND PELVIS WITH CONTRAST TECHNIQUE: Multidetector CT imaging of the chest was performed using the standard protocol during bolus administration of intravenous contrast. Multiplanar CT image reconstructions and MIPs were obtained to evaluate the vascular anatomy. Multidetector CT imaging of the abdomen and pelvis was performed using the standard protocol during bolus administration of intravenous contrast. CONTRAST:  149m OMNIPAQUE IOHEXOL 350 MG/ML SOLN COMPARISON:  None. FINDINGS: CTA CHEST FINDINGS Cardiovascular: Contrast injection is sufficient to demonstrate satisfactory opacification of the pulmonary arteries to the segmental level.There are small segmental pulmonary emboli within the right upper, middle and lower lobes. On the left, there is a segmental embolus in the left upper lobe. The main pulmonary artery is enlarged, measuring 4.1 cm. There is no CT evidence of acute right heart strain. There is moderate aortic atherosclerosis. There is a normal 3-vessel arch branching pattern. Heart size is normal. No pericardial effusion. There are coronary artery calcifications. Mediastinum/Nodes: No mediastinal, hilar or axillary lymphadenopathy. The visualized thyroid and thoracic esophageal course are unremarkable. Lungs/Pleura: No pulmonary nodules or masses. No pleural effusion or pneumothorax. No focal airspace consolidation. No focal pleural abnormality. Musculoskeletal: No chest wall abnormality. No acute or significant osseous findings. Review of the MIP images confirms the above findings. CT ABDOMEN and PELVIS FINDINGS Hepatobiliary: Innumerable lesions throughout the liver. The largest lesion is in the right hepatic lobe and measures approximately 5.7 cm. Mild gallbladder wall thickening Pancreas: Pancreatic duct is dilated in the distal body and tail. The tail is atrophic. Spleen: Normal. Adrenals/Urinary Tract: --Adrenal glands: Normal. --Right kidney/ureter: No  hydronephrosis or perinephric stranding. No nephrolithiasis. No obstructing ureteral stones. --Left kidney/ureter: No hydronephrosis or perinephric stranding. No nephrolithiasis. No obstructing ureteral stones. --Urinary bladder: Unremarkable. Stomach/Bowel: --Stomach/Duodenum: No hiatal hernia or other gastric abnormality. Normal duodenal course and caliber. --Small bowel: No dilatation or inflammation. --Colon: No focal abnormality. --Appendix: Not visualized. No right lower quadrant inflammation or free fluid. Vascular/Lymphatic: Aortic atherosclerosis. Aorta bi-iliac stent is patent. 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  No abdominal or pelvic lymphadenopathy. Reproductive: Status post hysterectomy. No adnexal mass. Small volume free fluid in the pelvis. Musculoskeletal. No bony spinal canal stenosis or focal osseous abnormality. Other: None. IMPRESSION: 1. Small segmental pulmonary emboli within the right upper, middle and lower lobes and left upper lobe. No CT evidence of acute right heart strain. 2. Innumerable liver lesions, consistent with metastatic disease. 3. Dilated main pulmonary artery, consistent with pulmonary hypertension. 4. 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 Critical Value/emergent results were called by telephone at the time of interpretation on 05/31/2021 at 9:10 pm to provider DAVID YAO , who verbally acknowledged these results. Aortic Atherosclerosis (ICD10-I70.0). Electronically Signed   By: Ulyses Jarred M.D.   On: 05/31/2021 21:11   CT Cervical Spine Wo Contrast  Result Date: 05/31/2021 CLINICAL DATA:  Mental status change, unknown cause; Neck trauma (Age >= 65y); Facial trauma. Increased confusion, slurred speech and multiple falls over the past week. EXAM: CT HEAD WITHOUT CONTRAST CT MAXILLOFACIAL WITHOUT  CONTRAST CT CERVICAL SPINE WITHOUT CONTRAST TECHNIQUE: Multidetector CT imaging of the head, cervical spine, and maxillofacial structures were performed using the standard protocol without intravenous contrast. Multiplanar CT image reconstructions of the cervical spine and maxillofacial structures were also generated. COMPARISON:  None. FINDINGS: CT HEAD FINDINGS Brain: Normal anatomic configuration. Parenchymal volume loss is commensurate with the patient's age. Moderate periventricular white matter changes are present likely reflecting the sequela of small vessel ischemia. Remote lacunar infarct versus dilated perivascular space within the right basal ganglia. No abnormal intra or extra-axial mass lesion or fluid collection. No abnormal mass effect or midline shift. No evidence of acute intracranial hemorrhage or infarct. Ventricular size is normal. Cerebellum unremarkable. Vascular: No asymmetric hyperdense vasculature at the skull base. Skull: Intact Other: Mastoid air cells and middle ear cavities are clear. CT MAXILLOFACIAL FINDINGS Osseous: Mild motion artifact noted. No acute facial fracture. No mandibular dislocation. Orbits: Negative. No traumatic or inflammatory finding. Sinuses: Clear. Soft tissues: Negative. CT CERVICAL SPINE FINDINGS Alignment: Normal.  No listhesis. Skull base and vertebrae: Craniocervical alignment is normal. The atlantodental interval is not widened. There is no acute fracture of the cervical spine. 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 No canal hematoma. The spinal canal is otherwise widely patent. No prevertebral soft tissue swelling. No pathologic adenopathy within the neck. No paraspinal fluid collections identified. Disc levels: There is mild intervertebral disc calcifications and posterior disc bulges noted at C3-C7 in keeping with changes of mild  degenerative disc disease. Intervertebral disc height is preserved. Vertebral body height is preserved. The prevertebral soft tissues are not thickened on sagittal reformats. 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 Upper chest: Unremarkable Other: None IMPRESSION: No acute intracranial injury.  No calvarial fracture. No acute facial fracture. No acute fracture or listhesis the cervical spine. Electronically Signed   By: Fidela Salisbury MD   On: 05/31/2021 21:03   CT ABDOMEN PELVIS W CONTRAST  Result Date: 05/31/2021 CLINICAL DATA:  Abdominal pain, elevated LFTs and encephalopathy EXAM: CT ANGIOGRAPHY CHEST CT ABDOMEN AND PELVIS WITH CONTRAST TECHNIQUE: Multidetector CT imaging of the chest was performed using the standard protocol during bolus administration of intravenous contrast. Multiplanar CT image reconstructions and MIPs were obtained to evaluate the vascular anatomy. Multidetector CT imaging of the  abdomen and pelvis was performed using the standard protocol during bolus administration of intravenous contrast. CONTRAST:  115m OMNIPAQUE IOHEXOL 350 MG/ML SOLN COMPARISON:  None. FINDINGS: CTA CHEST FINDINGS Cardiovascular: Contrast injection is sufficient to demonstrate satisfactory opacification of the pulmonary arteries to the segmental level.There are small segmental pulmonary emboli within the right upper, middle and lower lobes. On the left, there is a segmental embolus in the left upper lobe. The main pulmonary artery is enlarged, measuring 4.1 cm. There is no CT evidence of acute right heart strain. There is moderate aortic atherosclerosis. There is a normal 3-vessel arch branching pattern. Heart size is normal. No pericardial effusion. There are coronary artery calcifications. Mediastinum/Nodes: No mediastinal, hilar or axillary lymphadenopathy. The visualized thyroid and thoracic  esophageal course are unremarkable. Lungs/Pleura: No pulmonary nodules or masses. No pleural effusion or pneumothorax. No focal airspace consolidation. No focal pleural abnormality. Musculoskeletal: No chest wall abnormality. No acute or significant osseous findings. Review of the MIP images confirms the above findings. CT ABDOMEN and PELVIS FINDINGS Hepatobiliary: Innumerable lesions throughout the liver. The largest lesion is in the right hepatic lobe and measures approximately 5.7 cm. Mild gallbladder wall thickening Pancreas: Pancreatic duct is dilated in the distal body and tail. The tail is atrophic. Spleen: Normal. Adrenals/Urinary Tract: --Adrenal glands: Normal. --Right kidney/ureter: No hydronephrosis or perinephric stranding. No nephrolithiasis. No obstructing ureteral stones. --Left kidney/ureter: No hydronephrosis or perinephric stranding. No nephrolithiasis. No obstructing ureteral stones. --Urinary bladder: Unremarkable. Stomach/Bowel: --Stomach/Duodenum: No hiatal hernia or other gastric abnormality. Normal duodenal course and caliber. --Small bowel: No dilatation or inflammation. --Colon: No focal abnormality. --Appendix: Not visualized. No right lower quadrant inflammation or free fluid. Vascular/Lymphatic: Aortic atherosclerosis. Aorta bi-iliac stent is patent. 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 No abdominal or pelvic lymphadenopathy. Reproductive: Status post hysterectomy. No adnexal mass. Small volume free fluid in the pelvis. Musculoskeletal. No bony spinal canal stenosis or focal osseous abnormality. Other: None. IMPRESSION: 1. Small segmental pulmonary emboli within the right upper, middle and lower lobes and left upper lobe. No CT evidence of acute right heart strain. 2. Innumerable liver lesions, consistent with metastatic disease. 3. Dilated main pulmonary  artery, consistent with pulmonary hypertension. 4. 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 Critical Value/emergent results were called by telephone at the time of interpretation on 05/31/2021 at 9:10 pm to provider DAVID YAO , who verbally acknowledged these results. Aortic Atherosclerosis (ICD10-I70.0). Electronically Signed   By: KUlyses JarredM.D.   On: 05/31/2021 21:11   CT Maxillofacial Wo Contrast  Result Date: 05/31/2021 CLINICAL DATA:  Mental status change, unknown cause; Neck trauma (Age >= 65y); Facial trauma. Increased confusion, slurred speech and multiple falls over the past week. EXAM: CT HEAD WITHOUT CONTRAST CT MAXILLOFACIAL WITHOUT CONTRAST CT CERVICAL SPINE WITHOUT CONTRAST TECHNIQUE: Multidetector CT imaging of the head, cervical spine, and maxillofacial structures were performed using the standard protocol without intravenous contrast. Multiplanar CT image reconstructions of the cervical spine and maxillofacial structures were also generated. COMPARISON:  None. FINDINGS: CT HEAD FINDINGS Brain: Normal anatomic configuration. Parenchymal volume loss is commensurate with the patient's age. Moderate periventricular white matter changes are present likely reflecting the sequela of small vessel ischemia. Remote lacunar infarct versus dilated perivascular space within the right basal ganglia. No abnormal intra or extra-axial mass lesion or fluid collection. No abnormal mass  effect or midline shift. No evidence of acute intracranial hemorrhage or infarct. Ventricular size is normal. Cerebellum unremarkable. Vascular: No asymmetric hyperdense vasculature at the skull base. Skull: Intact Other: Mastoid air cells and middle ear cavities are clear. CT MAXILLOFACIAL FINDINGS Osseous: Mild motion artifact noted. No acute facial fracture. No mandibular dislocation. Orbits: Negative. No traumatic or inflammatory finding. Sinuses: Clear. Soft tissues:  Negative. CT CERVICAL SPINE FINDINGS Alignment: Normal.  No listhesis. Skull base and vertebrae: Craniocervical alignment is normal. The atlantodental interval is not widened. There is no acute fracture of the cervical spine. 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 No canal hematoma. The spinal canal is otherwise widely patent. No prevertebral soft tissue swelling. No pathologic adenopathy within the neck. No paraspinal fluid collections identified. Disc levels: There is mild intervertebral disc calcifications and posterior disc bulges noted at C3-C7 in keeping with changes of mild degenerative disc disease. Intervertebral disc height is preserved. Vertebral body height is preserved. The prevertebral soft tissues are not thickened on sagittal reformats. 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 Upper chest: Unremarkable Other: None IMPRESSION: No acute intracranial injury.  No calvarial fracture. No acute facial fracture. No acute fracture or listhesis the cervical spine. Electronically Signed   By: Fidela Salisbury MD   On: 05/31/2021 21:03    EKG: Independently reviewed.  Sinus tachycardia, baseline wander in lateral leads.  Assessment/Plan Principal Problem:   Acute pulmonary embolism (HCC) Active Problems:   Sepsis ([place])   Acute metabolic encephalopathy   Elevated liver enzymes   Anemia   Acute subsegmental PE Likely precipitating factors underlying malignancy given CT findings concerning for liver mets.  CT angiogram chest showing small segmental pulmonary emboli within the right upper, middle, and lower lobes and left upper lobe.  No CT evidence of acute right heart strain.  Patient is not hypoxic. -Continue IV heparin.  Continuous pulse ox, supplemental oxygen if  needed.  Sepsis Meets SIRS criteria with fever, tachycardia, and mild leukocytosis.  No hypotension or lactic acidosis to suggest severe sepsis.  No infectious etiology identified on CT chest/abdomen/pelvis.  COVID and influenza PCR negative.  Fever and tachycardia could also possibly be due to PE, however, infectious source needs to be ruled out. -Start vancomycin and aztreonam (penicillin allergy).  Blood culture x2 drawn and results pending.  UA and urine culture pending.  Check second set of lactate.  Check procalcitonin level.  Monitor WBC count. Addendum: Started on broad-spectrum antibiotics at this time including vancomycin, aztreonam, and metronidazole.  Acute metabolic encephalopathy in the setting of underlying dementia Family reporting increasing confusion, slurred speech, and multiple falls.  Suspicion for underlying infectious source given fever/sepsis.  Head CT negative for acute finding. -Brain MRI ordered to rule out stroke.  UA and urine culture pending.  Check TSH, B12, and ammonia levels.  Follow delirium cautions.  Dysphagia, unintentional weight loss, liver lesions Patient was admitted to [facility] about 2 weeks ago for evaluation of poor oral intake and significant weight loss.  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  CT done today showing innumerable liver lesions consistent with metastatic disease.  Primary malignancy unknown at this time.  Discussed with the patient's daughter who is aware of these findings.  She states patient had a colonoscopy done  about 5 years ago and a polyp was removed.  Also states patient had a mammogram done about 2 years ago which was normal.  She is not sure if EGD has been done yet.  I do not see results for colonoscopy, mammogram, or EGD in the chart. -I have sent a secure chat message to on-call physician for [facility] requesting consultation in the morning.  Please consult oncology during daytime.   Keep n.p.o. at this time, SLP eval.  Elevated LFTs Likely related to liver mets given significantly elevated alkaline phosphatase.  Ultrasound done at [facility] on 05/16/2021 showing multiple liver lesions, borderline gallbladder wall thickening without cholelithiasis or acute cholecystitis.  CT done today again showing innumerable lesions throughout the liver and mild gallbladder wall thickening.  Acute cholecystitis less likely as she has no abdominal tenderness on exam. -Continue to monitor LFTs.  Avoid hepatotoxic agents.  Normocytic anemia Hemoglobin stable compared to labs done during recent hospitalization. -Anemia panel  Mild thrombocytopenia Likely due to acute illness/sepsis. -Continue to monitor  Multiple falls -PT/OT eval, fall precautions  Paroxysmal A. Fib Not on chronic anticoagulation or a rate control agent.  Currently in sinus rhythm. -Cardiac monitoring  COPD Stable.  No signs of acute exacerbation. -Resume home inhalers after pharmacy med rec is done.  Hypertension Stable. -Hold antihypertensives at this time given concern for sepsis  Hyperlipidemia CK normal. -Resume home statin after pharmacy med rec is done.  DVT prophylaxis: Heparin Code Status: Full code-discussed with the patient's daughter. Family Communication: Daughter at bedside. Disposition Plan: Status is: Inpatient  Remains inpatient appropriate because:Inpatient level of care appropriate due to severity of illness  Dispo: The patient is from: Home              Anticipated d/c is to: Home              Patient currently is not medically stable to d/c.   Difficult to place patient No  Level of care: Level of care: Telemetry Cardiac  The medical decision making on this patient was of high complexity and the patient is at high risk for clinical deterioration, therefore this is a level 3 visit.  Shela Leff MD [facility]  If 7PM-7AM, please contact  night-coverage [email or web address]  05/31/2021, 10:54 PM

## 2021-05-31 NOTE — Progress Notes (Signed)
ANTICOAGULATION CONSULT NOTE - Initial Consult  Pharmacy Consult for Heparin Indication: pulmonary embolus  Allergies  Allergen Reactions   Penicillins Cross Reactors     Unknown    Sulfa Antibiotics     Patient Measurements: Height: '5\' 7"'$  (170.2 cm) Weight: 59 kg (130 lb) IBW/kg (Calculated) : 61.6 Heparin Dosing Weight: 59 kg  Vital Signs: Temp: 100.9 F (38.3 C) (08/03 1830) Temp Source: Oral (08/03 1830) BP: 128/70 (08/03 2100) Pulse Rate: 101 (08/03 2100)  Labs: Recent Labs    05/31/21 1711 05/31/21 1803  HGB 10.4*  --   HCT 31.2*  --   PLT 103*  --   APTT 38*  --   LABPROT 18.0*  --   INR 1.5*  --   CREATININE 0.71  --   CKTOTAL  --  81     Estimated Creatinine Clearance: 54.9 mL/min (by C-G formula based on SCr of 0.71 mg/dL).   Medical History: Past Medical History:  Diagnosis Date   A-fib ([place])    COPD (chronic obstructive pulmonary disease) (HCC)    High cholesterol    Hypertension     Medications:  Scheduled:   fentaNYL (SUBLIMAZE) injection  50 mcg Intravenous Once   heparin  4,000 Units Intravenous Once    Assessment: 75 YO female who presented with confusion, lower abdominal pain, multiple falls in the last week, slurred speech, and having issues swallowing so not eating or drinking. On chest CT, found small segmental pulmonary emboli within the right upper, middle and lower lobes and upper left lobe.  No AC PTA. INR 1.5, aPTT 38.   Goal of Therapy:  Heparin level 0.3-0.7 units/ml Monitor platelets by anticoagulation protocol: Yes   Plan:  Give 4000 units bolus x 1 Start heparin infusion at 1000 units/hr Check anti-Xa level in 6 hours and daily while on heparin Continue to monitor H&H and platelets F/u on transition to PO AC.  Joetta Manners, PharmD, [facility] Clinical Pharmacist ED RPh Phone: [place]: [phone]

## 2021-05-31 NOTE — ED Triage Notes (Signed)
Pt here from home, pt lives w/ daughters. Per daughter pt has had increased confusion, slurred speech multiple falls over the past week. Pt c/o abd pain, w/ nausea denies vomiting. Pt alert to self only.

## 2021-06-01 ENCOUNTER — Other Ambulatory Visit (HOSPITAL_COMMUNITY): Payer: Self-pay

## 2021-06-01 ENCOUNTER — Inpatient Hospital Stay (HOSPITAL_COMMUNITY): Payer: Medicare Other

## 2021-06-01 DIAGNOSIS — R748 Abnormal levels of other serum enzymes: Secondary | ICD-10-CM

## 2021-06-01 DIAGNOSIS — C787 Secondary malignant neoplasm of liver and intrahepatic bile duct: Secondary | ICD-10-CM

## 2021-06-01 DIAGNOSIS — I2699 Other pulmonary embolism without acute cor pulmonale: Secondary | ICD-10-CM

## 2021-06-01 LAB — FOLATE: Folate: 13 ng/mL (ref >5.9)

## 2021-06-01 LAB — IRON AND TIBC
Iron: 19 ug/dL — ABNORMAL LOW (ref 28–170)
Saturation Ratios: 12 % (ref 10.4–31.8)
TIBC: 155 ug/dL — ABNORMAL LOW (ref 250–450)
UIBC: 136 ug/dL

## 2021-06-01 LAB — RETICULOCYTES
Immature Retic Fract: 28.7 % — ABNORMAL HIGH (ref 2.3–15.9)
RBC.: 3.35 MIL/uL — ABNORMAL LOW (ref 3.87–5.11)
Retic Count, Absolute: 121.3 10*3/uL (ref 19.0–186.0)
Retic Ct Pct: 3.6 % — ABNORMAL HIGH (ref 0.4–3.1)

## 2021-06-01 LAB — FERRITIN: Ferritin: 1079 ng/mL — ABNORMAL HIGH (ref 11–307)

## 2021-06-01 LAB — CBC
HCT: 30.4 % — ABNORMAL LOW (ref 36.0–46.0)
Hemoglobin: 9.5 g/dL — ABNORMAL LOW (ref 12.0–15.0)
MCH: 28.5 pg (ref 26.0–34.0)
MCHC: 31.3 g/dL (ref 30.0–36.0)
MCV: 91.3 fL (ref 80.0–100.0)
Platelets: 112 10*3/uL — ABNORMAL LOW (ref 150–400)
RBC: 3.33 MIL/uL — ABNORMAL LOW (ref 3.87–5.11)
RDW: 17.3 % — ABNORMAL HIGH (ref 11.5–15.5)
WBC: 13.6 10*3/uL — ABNORMAL HIGH (ref 4.0–10.5)
nRBC: 0 % (ref 0.0–0.2)

## 2021-06-01 LAB — URINALYSIS, ROUTINE W REFLEX MICROSCOPIC
Bilirubin Urine: NEGATIVE
Glucose, UA: NEGATIVE mg/dL
Ketones, ur: 5 mg/dL — AB
Leukocytes,Ua: NEGATIVE
Nitrite: NEGATIVE
Protein, ur: 30 mg/dL — AB
Specific Gravity, Urine: 1.043 — ABNORMAL HIGH (ref 1.005–1.030)
pH: 5 (ref 5.0–8.0)

## 2021-06-01 LAB — COMPREHENSIVE METABOLIC PANEL
ALT: 41 U/L (ref 0–44)
AST: 110 U/L — ABNORMAL HIGH (ref 15–41)
Albumin: 2.3 g/dL — ABNORMAL LOW (ref 3.5–5.0)
Alkaline Phosphatase: 565 U/L — ABNORMAL HIGH (ref 38–126)
Anion gap: 13 (ref 5–15)
BUN: 17 mg/dL (ref 8–23)
CO2: 23 mmol/L (ref 22–32)
Calcium: 8.2 mg/dL — ABNORMAL LOW (ref 8.9–10.3)
Chloride: 98 mmol/L (ref 98–111)
Creatinine, Ser: 0.53 mg/dL (ref 0.44–1.00)
GFR, Estimated: >60 mL/min (ref >60)
Glucose, Bld: 111 mg/dL — ABNORMAL HIGH (ref 70–99)
Potassium: 3.3 mmol/L — ABNORMAL LOW (ref 3.5–5.1)
Sodium: 134 mmol/L — ABNORMAL LOW (ref 135–145)
Total Bilirubin: 2.6 mg/dL — ABNORMAL HIGH (ref 0.3–1.2)
Total Protein: 6 g/dL — ABNORMAL LOW (ref 6.5–8.1)

## 2021-06-01 LAB — HEPARIN LEVEL (UNFRACTIONATED)
Heparin Unfractionated: 0.15 IU/mL — ABNORMAL LOW (ref 0.30–0.70)
Heparin Unfractionated: 0.26 IU/mL — ABNORMAL LOW (ref 0.30–0.70)

## 2021-06-01 LAB — TSH: TSH: 2.222 u[IU]/mL (ref 0.350–4.500)

## 2021-06-01 LAB — AMMONIA: Ammonia: 51 umol/L — ABNORMAL HIGH (ref 9–35)

## 2021-06-01 LAB — PROCALCITONIN: Procalcitonin: 1.54 ng/mL

## 2021-06-01 LAB — PATHOLOGIST SMEAR REVIEW

## 2021-06-01 LAB — VITAMIN B12: Vitamin B-12: 3278 pg/mL — ABNORMAL HIGH (ref 180–914)

## 2021-06-01 MED ORDER — METOPROLOL SUCCINATE ER 25 MG PO TB24
25.0000 mg | ORAL_TABLET | Freq: Every day | ORAL | Status: DC
  Administered 2021-06-01 – 2021-06-04 (×4): 25 mg via ORAL
  Filled 2021-06-01 (×4): qty 1

## 2021-06-01 MED ORDER — DILTIAZEM HCL ER COATED BEADS 180 MG PO CP24
180.0000 mg | ORAL_CAPSULE | Freq: Every day | ORAL | Status: DC
  Administered 2021-06-01 – 2021-06-04 (×4): 180 mg via ORAL
  Filled 2021-06-01 (×4): qty 1

## 2021-06-01 MED ORDER — POTASSIUM CHLORIDE 10 MEQ/100ML IV SOLN
10.0000 meq | INTRAVENOUS | Status: AC
  Administered 2021-06-01 (×2): 10 meq via INTRAVENOUS
  Filled 2021-06-01 (×3): qty 100

## 2021-06-01 MED ORDER — DILTIAZEM HCL ER 180 MG PO CP24
180.0000 mg | ORAL_CAPSULE | Freq: Every day | ORAL | Status: DC
  Filled 2021-06-01: qty 1

## 2021-06-01 MED ORDER — POLYETHYLENE GLYCOL 3350 17 G PO PACK: 17.0000 g | PACK | Freq: Every day | ORAL | Status: DC | PRN

## 2021-06-01 MED ORDER — RIVASTIGMINE TARTRATE 1.5 MG PO CAPS
4.5000 mg | ORAL_CAPSULE | Freq: Two times a day (BID) | ORAL | Status: DC
  Administered 2021-06-01 – 2021-06-02 (×3): 4.5 mg via ORAL
  Filled 2021-06-01 (×7): qty 3

## 2021-06-01 MED ORDER — FLUTICASONE FUROATE-VILANTEROL 100-25 MCG/INH IN AEPB
1.0000 | INHALATION_SPRAY | Freq: Every day | RESPIRATORY_TRACT | Status: DC
  Administered 2021-06-03 – 2021-06-06 (×4): 1 via RESPIRATORY_TRACT
  Filled 2021-06-01: qty 28

## 2021-06-01 MED ORDER — UMECLIDINIUM BROMIDE 62.5 MCG/INH IN AEPB
1.0000 | INHALATION_SPRAY | Freq: Every day | RESPIRATORY_TRACT | Status: DC
  Administered 2021-06-03 – 2021-06-06 (×4): 1 via RESPIRATORY_TRACT
  Filled 2021-06-01: qty 7

## 2021-06-01 MED ORDER — FLUTICASONE-UMECLIDIN-VILANT 100-62.5-25 MCG/INH IN AEPB: 1.0000 | INHALATION_SPRAY | Freq: Every day | RESPIRATORY_TRACT | Status: DC

## 2021-06-01 MED ORDER — HEPARIN BOLUS VIA INFUSION
1500.0000 [IU] | Freq: Once | INTRAVENOUS | Status: AC
  Administered 2021-06-01: 1500 [IU] via INTRAVENOUS
  Filled 2021-06-01: qty 1500

## 2021-06-01 MED ORDER — ZOLPIDEM TARTRATE 5 MG PO TABS
5.0000 mg | ORAL_TABLET | Freq: Every evening | ORAL | Status: DC | PRN
  Administered 2021-06-01 – 2021-06-05 (×6): 5 mg via ORAL
  Filled 2021-06-01 (×6): qty 1

## 2021-06-01 NOTE — Evaluation (Signed)
Clinical/Bedside Swallow Evaluation Patient Details  Name: Anne West MRN: [id number] Date of Birth: [birth year]  Today's Date: 06/01/2021 Time: SLP Start Time (ACUTE ONLY): [id number] SLP Stop Time (ACUTE ONLY): 1505 SLP Time Calculation (min) (ACUTE ONLY): 12 min  Past Medical History:  Past Medical History:  Diagnosis Date   A-fib ([place])    COPD (chronic obstructive pulmonary disease) (HCC)    High cholesterol    Hypertension    Past Surgical History:  Past Surgical History:  Procedure Laterality Date   ABDOMINAL HYSTERECTOMY     BACK SURGERY     HPI:  75 y.o. female brought to the ED by her daughter for evaluation of confusion, slurred speech, lower abdominal pain, multiple falls, not eating or drinking and having difficulty swallowing. Dx acute PE, sepsis, acute metabolic encephalopathy, dysphagia. PMH of dementia, AAA repair, A. fib not on anticoagulation, COPD, hypertension, hyperlipidemia.  Patient was admitted to [facility] about 2 weeks ago for abdominal pain secondary to constipation in the setting of opiate use, poor oral intake, and significant weight loss.  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   Assessment / Plan / Recommendation Clinical Impression  Pt just arrived to floor from ED. Sister at bedside.  Pt needed encouragement to participate in clinical swallowing assessment. Her sister reports some difficulty swallowing pills PTA, requiring the need to be crushed, but no coughing/choking when eating. Pt accepted sips of water and pudding with adequate attention and oral preparation, the appearance of a brisk swallow response, and no s/s of aspiration.  She described PO as "nasty" and declined further trials with solid foods.  Despite small quantities of POs during eval, there were no overt s/s of an oropharyngeal dysphagia nor aspiration.    Recommend advancing diet per nursing staff and pt's preferences. No SLP f/u is needed.   Our service will sign off. SLP Visit Diagnosis: Dysphagia, unspecified (R13.10)    Aspiration Risk  No limitations    Diet Recommendation   Continue with full liquids as ordered - advance as tolerated per RN.  Medication Administration: Whole meds with liquid (crush if unable to swallow with water)    Other  Recommendations Oral Care Recommendations: Oral care BID   Follow up Recommendations None        Swallow Study   General HPI: 75 y.o. female brought to the ED by her daughter for evaluation of confusion, slurred speech, lower abdominal pain, multiple falls, not eating or drinking and having difficulty swallowing. Dx acute PE, sepsis, acute metabolic encephalopathy, dysphagia. PMH of dementia, AAA repair, A. fib not on anticoagulation, COPD, hypertension, hyperlipidemia.  Patient was admitted to [facility] about 2 weeks ago for abdominal pain secondary to constipation in the setting of opiate use, poor oral intake, and significant weight loss.  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 Type of Study: Bedside Swallow Evaluation Previous Swallow Assessment: no Diet Prior to this Study: Other (Comment) (full liquid diet) Temperature Spikes Noted: No Respiratory Status: Room air History of Recent Intubation: No Behavior/Cognition: Alert;Confused Oral Cavity Assessment: Within Functional Limits Oral Care Completed by SLP: No Oral Cavity - Dentition: Edentulous Vision: Functional for self-feeding Self-Feeding Abilities: Needs assist Patient Positioning: Upright in bed Baseline Vocal Quality: Normal Volitional Cough: Strong Volitional Swallow: Able to elicit    Oral/Motor/Sensory Function Overall Oral Motor/Sensory Function: Within functional limits   Ice Chips Ice chips:  (pt declined)   Thin Liquid  Thin Liquid: Within functional limits    Nectar Thick Nectar Thick Liquid: Not tested   Honey Thick Honey Thick Liquid: Not tested   Puree  Puree: Within functional limits   Solid    Noa Galvao L. Tivis Ringer, MA CCC/SLP Acute Rehabilitation Services Office number [phone] Pager [phone]  Solid: Not tested      [facility] 06/01/2021,3:12 PM

## 2021-06-01 NOTE — ED Notes (Signed)
Help get patient clean up place another external cath changed brief patient is resting with call bell in reach

## 2021-06-01 NOTE — Progress Notes (Signed)
ANTICOAGULATION CONSULT NOTE  Pharmacy Consult for Heparin Indication: pulmonary embolus  Allergies  Allergen Reactions   Donepezil Itching    SEVERE ITCHING   Penicillins Cross Reactors     Unknown    Sulfa Antibiotics     Patient Measurements: Height: '5\' 7"'$  (170.2 cm) Weight: 59 kg (130 lb) IBW/kg (Calculated) : 61.6 Heparin Dosing Weight: 59 kg  Vital Signs: BP: 140/52 (08/04 0700) Pulse Rate: 92 (08/04 0700)  Labs: Recent Labs    05/31/21 1711 05/31/21 1803 06/01/21 0556 06/01/21 0653  HGB 10.4*  --  9.5*  --   HCT 31.2*  --  30.4*  --   PLT 103*  --  112*  --   APTT 38*  --   --   --   LABPROT 18.0*  --   --   --   INR 1.5*  --   --   --   HEPARINUNFRC  --   --   --  0.15*  CREATININE 0.71  --  0.53  --   CKTOTAL  --  81  --   --      Estimated Creatinine Clearance: 54.9 mL/min (by C-G formula based on SCr of 0.53 mg/dL).   Medical History: Past Medical History:  Diagnosis Date   A-fib ([place])    COPD (chronic obstructive pulmonary disease) (HCC)    High cholesterol    Hypertension     Medications:  Scheduled:    Assessment: 75 YO female who presented with confusion, lower abdominal pain, multiple falls in the last week, slurred speech, and having issues swallowing so not eating or drinking. On chest CT, found small segmental pulmonary emboli within the right upper, middle and lower lobes and upper left lobe.  No AC PTA. Initial heparin level this morning came back subtherapeutic at 0.15, on 1000 units/hr. Hgb 9.5, plt 112. No s/sx of bleeding or infusion issues.   Goal of Therapy:  Heparin level 0.3-0.7 units/ml Monitor platelets by anticoagulation protocol: Yes   Plan:  Give 1500 units bolus x 1 Increase heparin infusion to 1200 units/hr Check anti-Xa level in 6 hours and daily while on heparin Continue to monitor H&H and platelets F/u on transition to PO AC.  Antonietta Jewel, PharmD, [place] Clinical Pharmacist  Phone: [phone] 06/01/2021  7:59 AM  Please check AMION for all [place] phone numbers After 10:00 PM, call [place] [phone]

## 2021-06-01 NOTE — H&P (Addendum)
Chief Complaint: Liver lesions  Referring Physician(s): Patrecia Pour, MD  Supervising Physician: Mir, Sharen Heck  Patient Status: [facility] - In-pt  History of Present Illness: Anne West is a 75 y.o. female with medical issues including dementia,  A. 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  She was brought to the ED by her daughter for evaluation of confusion, slurred speech, abdominal pain, multiple falls, and poor PO intake.  In the ED she was febrile, 100.4 F on arrival and tachycardic with heart rate in the 110s.    Labs = WBC 14.4, hemoglobin 10.4, platelet count 103.  Sodium 134, potassium 3.7, chloride 92, bicarb 28, BUN 21, creatinine 0.7, glucose 102.    LFTs elevated: AST 133, ALT 145, alk phos 814, T bili 2.5.  Lactic acid 1.5.  INR 1.5.  CK 81.  CT showed innumerable liver lesions, consistent with metastatic disease.  CTA showed Small segmental pulmonary emboli within the right upper, middle and lower lobes and left upper lobe. She is currently on a heparin drip.  We are asked to perform an image guided biopsy of a liver lesion.      Past Medical History:  Diagnosis Date   A-fib ([place])    COPD (chronic obstructive pulmonary disease) (HCC)    High cholesterol    Hypertension     Past Surgical History:  Procedure Laterality Date   ABDOMINAL HYSTERECTOMY     BACK SURGERY      Allergies: Donepezil, Penicillins cross reactors, and Sulfa antibiotics  Medications: Prior to Admission medications   Medication Sig Start Date End Date Taking? Authorizing Provider  albuterol (PROVENTIL HFA;VENTOLIN HFA) 108 (90 BASE) MCG/ACT inhaler Inhale 2 puffs into the lungs every 6 (six) hours as needed for wheezing.   Yes [provider]  amLODipine (NORVASC) 5 MG tablet Take 5 mg by mouth daily.   Yes [provider]  aspirin EC 81 MG tablet Take 81 mg by mouth daily. Swallow whole.    Yes [provider]  atorvastatin (LIPITOR) 40 MG tablet Take 40 mg by mouth daily.   Yes [provider]  Cholecalciferol 25 MCG (1000 UT) capsule Take 1,000 Units by mouth daily.   Yes [provider]  DILT-XR 180 MG 24 hr capsule Take 180 mg by mouth daily. 05/29/21  Yes [provider]  fluticasone (FLONASE) 50 MCG/ACT nasal spray Place 1 spray into the nose daily as needed for allergies or rhinitis.   Yes [provider]  gabapentin (NEURONTIN) 800 MG tablet Take 800 mg by mouth in the morning and at bedtime. 04/22/21  Yes [provider]  HYDROcodone-acetaminophen (NORCO) 7.5-325 MG tablet Take 1 tablet by mouth 2 (two) times daily as needed for moderate pain. 11/04/17  Yes [provider]  ibuprofen (ADVIL,MOTRIN) 200 MG tablet Take 400 mg by mouth every 6 (six) hours as needed for headache or moderate pain. For fever and pain   Yes [provider]  Magnesium 250 MG TABS Take 250 mg by mouth daily.   Yes [provider]  metoprolol succinate (TOPROL-XL) 25 MG 24 hr tablet Take 25 mg by mouth daily. 04/18/21  Yes [provider]  polyethylene glycol powder (GLYCOLAX/MIRALAX) 17 GM/SCOOP powder Take 17 g by mouth daily as needed. 05/17/21  Yes [provider]  POTASSIUM PO Take 1 tablet by mouth daily. OTC   Yes [provider]  rivastigmine (EXELON) 4.5  MG capsule Take 4.5 mg by mouth 2 (two) times daily. 04/22/21  Yes [provider]  TRELEGY ELLIPTA 100-62.5-25 MCG/INH AEPB Take 1 puff by mouth daily. 05/11/21  Yes [provider]  zolpidem (AMBIEN) 5 MG tablet Take 5 mg by mouth at bedtime. 04/20/21  Yes [provider]  hydrOXYzine (ATARAX/VISTARIL) 25 MG tablet Take 1 tablet (25 mg total) by mouth every 6 (six) hours as needed for itching. Patient not taking: No sig reported 07/26/16   Tanna Furry, MD     History reviewed. No pertinent family history.  Social  History   Socioeconomic History   Marital status: Divorced    Spouse name: Not on file   Number of children: Not on file   Years of education: Not on file   Highest education level: Not on file  Occupational History   Not on file  Tobacco Use   Smoking status: Every Day    Types: Cigarettes   Smokeless tobacco: Never  Substance and Sexual Activity   Alcohol use: No   Drug use: No   Sexual activity: Not on file  Other Topics Concern   Not on file  Social History Narrative   Not on file   Social Determinants of Health   Financial Resource Strain: Not on file  Food Insecurity: Not on file  Transportation Needs: Not on file  Physical Activity: Not on file  Stress: Not on file  Social Connections: Not on file    Review of Systems  Unable to perform ROS: Mental status change   Vital Signs: BP 140/73   Pulse 84   Temp 98.7 F (37.1 C) (Oral)   Resp 15   Ht 5' 7"  (1.702 m)   Wt 59 kg   SpO2 98%   BMI 20.36 kg/m   Physical Exam Vitals reviewed.  Constitutional:      Appearance: Normal appearance.  Cardiovascular:     Rate and Rhythm: Tachycardia present.  Pulmonary:     Effort: Pulmonary effort is normal. No respiratory distress.     Breath sounds: Normal breath sounds.  Abdominal:     General: There is no distension.     Palpations: Abdomen is soft.  Skin:    General: Skin is warm and dry.  Neurological:     General: No focal deficit present.     Mental Status: She is alert.  Psychiatric:        Mood and Affect: Mood normal.    Imaging: DG Chest 1 View  Result Date: 05/31/2021 CLINICAL DATA:  Multiple falls, slurred speech, abdominal pain EXAM: CHEST  1 VIEW COMPARISON:  05/16/2021 FINDINGS: Single frontal view of the chest demonstrates an unremarkable cardiac silhouette. No acute airspace disease, effusion, or pneumothorax. Chronic interstitial scarring is noted. No acute bony abnormalities. Partial visualization of aortic stent. IMPRESSION: 1. No acute  intrathoracic process. Electronically Signed   By: Randa Ngo M.D.   On: 05/31/2021 18:28   CT HEAD WO CONTRAST (5MM)  Result Date: 05/31/2021 CLINICAL DATA:  Mental status change, unknown cause; Neck trauma (Age >= 65y); Facial trauma. Increased confusion, slurred speech and multiple falls over the past week. EXAM: CT HEAD WITHOUT CONTRAST CT MAXILLOFACIAL WITHOUT CONTRAST CT CERVICAL SPINE WITHOUT CONTRAST TECHNIQUE: Multidetector CT imaging of the head, cervical spine, and maxillofacial structures were performed using the standard protocol without intravenous contrast. Multiplanar CT image reconstructions of the cervical spine and maxillofacial structures were also generated. COMPARISON:  None. FINDINGS: CT HEAD FINDINGS  Brain: Normal anatomic configuration. Parenchymal volume loss is commensurate with the patient's age. Moderate periventricular white matter changes are present likely reflecting the sequela of small vessel ischemia. Remote lacunar infarct versus dilated perivascular space within the right basal ganglia. No abnormal intra or extra-axial mass lesion or fluid collection. No abnormal mass effect or midline shift. No evidence of acute intracranial hemorrhage or infarct. Ventricular size is normal. Cerebellum unremarkable. Vascular: No asymmetric hyperdense vasculature at the skull base. Skull: Intact Other: Mastoid air cells and middle ear cavities are clear. CT MAXILLOFACIAL FINDINGS Osseous: Mild motion artifact noted. No acute facial fracture. No mandibular dislocation. Orbits: Negative. No traumatic or inflammatory finding. Sinuses: Clear. Soft tissues: Negative. CT CERVICAL SPINE FINDINGS Alignment: Normal.  No listhesis. Skull base and vertebrae: Craniocervical alignment is normal. The atlantodental interval is not widened. There is no acute fracture of the cervical spine. 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 No canal hematoma. The spinal canal is otherwise widely patent. No prevertebral soft tissue swelling. No pathologic adenopathy within the neck. No paraspinal fluid collections identified. Disc levels: There is mild intervertebral disc calcifications and posterior disc bulges noted at C3-C7 in keeping with changes of mild degenerative disc disease. Intervertebral disc height is preserved. Vertebral body height is preserved. The prevertebral soft tissues are not thickened on sagittal reformats. 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 Upper chest: Unremarkable Other: None IMPRESSION: No acute intracranial injury.  No calvarial fracture. No acute facial fracture. No acute fracture or listhesis the cervical spine. Electronically Signed   By: Fidela Salisbury MD   On: 05/31/2021 21:03   CT Angio Chest PE W and/or Wo Contrast  Result Date: 05/31/2021 CLINICAL DATA:  Abdominal pain, elevated LFTs and encephalopathy EXAM: CT ANGIOGRAPHY CHEST CT ABDOMEN AND PELVIS WITH CONTRAST TECHNIQUE: Multidetector CT imaging of the chest was performed using the standard protocol during bolus administration of intravenous contrast. Multiplanar CT image reconstructions and MIPs were obtained to evaluate the vascular anatomy. Multidetector CT imaging of the abdomen and pelvis was performed using the standard protocol during bolus administration of intravenous contrast. CONTRAST:  115m OMNIPAQUE IOHEXOL 350 MG/ML SOLN COMPARISON:  None. FINDINGS: CTA CHEST FINDINGS Cardiovascular: Contrast injection is sufficient to demonstrate satisfactory opacification of the pulmonary arteries to the segmental level.There are small segmental pulmonary emboli within the right upper, middle and lower lobes. On the left, there is a segmental embolus in the left upper lobe. The main pulmonary  artery is enlarged, measuring 4.1 cm. There is no CT evidence of acute right heart strain. There is moderate aortic atherosclerosis. There is a normal 3-vessel arch branching pattern. Heart size is normal. No pericardial effusion. There are coronary artery calcifications. Mediastinum/Nodes: No mediastinal, hilar or axillary lymphadenopathy. The visualized thyroid and thoracic esophageal course are unremarkable. Lungs/Pleura: No pulmonary nodules or masses. No pleural effusion or pneumothorax. No focal airspace consolidation. No focal pleural abnormality. Musculoskeletal: No chest wall abnormality. No acute or significant osseous findings. Review of the MIP images confirms the above findings. CT ABDOMEN and PELVIS FINDINGS Hepatobiliary: Innumerable lesions throughout the liver. The largest lesion is in the right hepatic lobe and measures approximately 5.7 cm. Mild gallbladder wall thickening Pancreas: Pancreatic duct is dilated in the distal body and tail. The tail is atrophic. Spleen: Normal. Adrenals/Urinary Tract: --Adrenal  glands: Normal. --Right kidney/ureter: No hydronephrosis or perinephric stranding. No nephrolithiasis. No obstructing ureteral stones. --Left kidney/ureter: No hydronephrosis or perinephric stranding. No nephrolithiasis. No obstructing ureteral stones. --Urinary bladder: Unremarkable. Stomach/Bowel: --Stomach/Duodenum: No hiatal hernia or other gastric abnormality. Normal duodenal course and caliber. --Small bowel: No dilatation or inflammation. --Colon: No focal abnormality. --Appendix: Not visualized. No right lower quadrant inflammation or free fluid. Vascular/Lymphatic: Aortic atherosclerosis. Aorta bi-iliac stent is patent. 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 No abdominal or pelvic lymphadenopathy. Reproductive: Status post hysterectomy. No adnexal mass. Small  volume free fluid in the pelvis. Musculoskeletal. No bony spinal canal stenosis or focal osseous abnormality. Other: None. IMPRESSION: 1. Small segmental pulmonary emboli within the right upper, middle and lower lobes and left upper lobe. No CT evidence of acute right heart strain. 2. Innumerable liver lesions, consistent with metastatic disease. 3. Dilated main pulmonary artery, consistent with pulmonary hypertension. 4. 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 Critical Value/emergent results were called by telephone at the time of interpretation on 05/31/2021 at 9:10 pm to provider DAVID YAO , who verbally acknowledged these results. Aortic Atherosclerosis (ICD10-I70.0). Electronically Signed   By: Ulyses Jarred M.D.   On: 05/31/2021 21:11   CT Cervical Spine Wo Contrast  Result Date: 05/31/2021 CLINICAL DATA:  Mental status change, unknown cause; Neck trauma (Age >= 65y); Facial trauma. Increased confusion, slurred speech and multiple falls over the past week. EXAM: CT HEAD WITHOUT CONTRAST CT MAXILLOFACIAL WITHOUT CONTRAST CT CERVICAL SPINE WITHOUT CONTRAST TECHNIQUE: Multidetector CT imaging of the head, cervical spine, and maxillofacial structures were performed using the standard protocol without intravenous contrast. Multiplanar CT image reconstructions of the cervical spine and maxillofacial structures were also generated. COMPARISON:  None. FINDINGS: CT HEAD FINDINGS Brain: Normal anatomic configuration. Parenchymal volume loss is commensurate with the patient's age. Moderate periventricular white matter changes are present likely reflecting the sequela of small vessel ischemia. Remote lacunar infarct versus dilated perivascular space within the right basal ganglia. No abnormal intra or extra-axial mass lesion or fluid collection. No abnormal mass effect or midline shift. No evidence of acute intracranial hemorrhage or infarct. Ventricular size is  normal. Cerebellum unremarkable. Vascular: No asymmetric hyperdense vasculature at the skull base. Skull: Intact Other: Mastoid air cells and middle ear cavities are clear. CT MAXILLOFACIAL FINDINGS Osseous: Mild motion artifact noted. No acute facial fracture. No mandibular dislocation. Orbits: Negative. No traumatic or inflammatory finding. Sinuses: Clear. Soft tissues: Negative. CT CERVICAL SPINE FINDINGS Alignment: Normal.  No listhesis. Skull base and vertebrae: Craniocervical alignment is normal. The atlantodental interval is not widened. There is no acute fracture of the cervical spine. 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 No canal hematoma. The spinal canal is otherwise widely patent. No prevertebral soft tissue swelling. No pathologic adenopathy within the neck. No paraspinal fluid collections identified. Disc levels: There is mild intervertebral disc calcifications and posterior disc bulges noted at C3-C7 in keeping with changes of mild degenerative disc disease. Intervertebral disc height is preserved. Vertebral body height is preserved. The prevertebral soft tissues are not thickened on sagittal reformats. 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 Upper chest: Unremarkable Other: None IMPRESSION:  No acute intracranial injury.  No calvarial fracture. No acute facial fracture. No acute fracture or listhesis the cervical spine. Electronically Signed   By: Fidela Salisbury MD   On: 05/31/2021 21:03   CT ABDOMEN PELVIS W CONTRAST  Result Date: 05/31/2021 CLINICAL DATA:  Abdominal pain, elevated LFTs and encephalopathy EXAM: CT ANGIOGRAPHY CHEST CT ABDOMEN AND PELVIS WITH CONTRAST TECHNIQUE: Multidetector CT imaging of the chest was performed using the standard protocol  during bolus administration of intravenous contrast. Multiplanar CT image reconstructions and MIPs were obtained to evaluate the vascular anatomy. Multidetector CT imaging of the abdomen and pelvis was performed using the standard protocol during bolus administration of intravenous contrast. CONTRAST:  169m OMNIPAQUE IOHEXOL 350 MG/ML SOLN COMPARISON:  None. FINDINGS: CTA CHEST FINDINGS Cardiovascular: Contrast injection is sufficient to demonstrate satisfactory opacification of the pulmonary arteries to the segmental level.There are small segmental pulmonary emboli within the right upper, middle and lower lobes. On the left, there is a segmental embolus in the left upper lobe. The main pulmonary artery is enlarged, measuring 4.1 cm. There is no CT evidence of acute right heart strain. There is moderate aortic atherosclerosis. There is a normal 3-vessel arch branching pattern. Heart size is normal. No pericardial effusion. There are coronary artery calcifications. Mediastinum/Nodes: No mediastinal, hilar or axillary lymphadenopathy. The visualized thyroid and thoracic esophageal course are unremarkable. Lungs/Pleura: No pulmonary nodules or masses. No pleural effusion or pneumothorax. No focal airspace consolidation. No focal pleural abnormality. Musculoskeletal: No chest wall abnormality. No acute or significant osseous findings. Review of the MIP images confirms the above findings. CT ABDOMEN and PELVIS FINDINGS Hepatobiliary: Innumerable lesions throughout the liver. The largest lesion is in the right hepatic lobe and measures approximately 5.7 cm. Mild gallbladder wall thickening Pancreas: Pancreatic duct is dilated in the distal body and tail. The tail is atrophic. Spleen: Normal. Adrenals/Urinary Tract: --Adrenal glands: Normal. --Right kidney/ureter: No hydronephrosis or perinephric stranding. No nephrolithiasis. No obstructing ureteral stones. --Left kidney/ureter: No hydronephrosis or perinephric  stranding. No nephrolithiasis. No obstructing ureteral stones. --Urinary bladder: Unremarkable. Stomach/Bowel: --Stomach/Duodenum: No hiatal hernia or other gastric abnormality. Normal duodenal course and caliber. --Small bowel: No dilatation or inflammation. --Colon: No focal abnormality. --Appendix: Not visualized. No right lower quadrant inflammation or free fluid. Vascular/Lymphatic: Aortic atherosclerosis. Aorta bi-iliac stent is patent. 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 No abdominal or pelvic lymphadenopathy. Reproductive: Status post hysterectomy. No adnexal mass. Small volume free fluid in the pelvis. Musculoskeletal. No bony spinal canal stenosis or focal osseous abnormality. Other: None. IMPRESSION: 1. Small segmental pulmonary emboli within the right upper, middle and lower lobes and left upper lobe. No CT evidence of acute right heart strain. 2. Innumerable liver lesions, consistent with metastatic disease. 3. Dilated main pulmonary artery, consistent with pulmonary hypertension. 4. 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 Critical Value/emergent results were called by telephone at the time of interpretation on 05/31/2021 at 9:10 pm to provider DAVID YAO , who verbally acknowledged these results. Aortic Atherosclerosis (ICD10-I70.0). Electronically Signed   By: KUlyses JarredM.D.   On: 05/31/2021 21:11   CT Maxillofacial Wo Contrast  Result Date: 05/31/2021 CLINICAL DATA:  Mental status change, unknown cause; Neck trauma (Age >= 65y); Facial trauma. Increased confusion, slurred speech and multiple falls over the past week. EXAM: CT HEAD WITHOUT CONTRAST CT MAXILLOFACIAL WITHOUT  CONTRAST CT CERVICAL SPINE WITHOUT CONTRAST TECHNIQUE: Multidetector CT imaging of the head, cervical spine, and maxillofacial structures were  performed using the standard protocol without intravenous contrast. Multiplanar CT image reconstructions of the cervical spine and maxillofacial structures were also generated. COMPARISON:  None. FINDINGS: CT HEAD FINDINGS Brain: Normal anatomic configuration. Parenchymal volume loss is commensurate with the patient's age. Moderate periventricular white matter changes are present likely reflecting the sequela of small vessel ischemia. Remote lacunar infarct versus dilated perivascular space within the right basal ganglia. No abnormal intra or extra-axial mass lesion or fluid collection. No abnormal mass effect or midline shift. No evidence of acute intracranial hemorrhage or infarct. Ventricular size is normal. Cerebellum unremarkable. Vascular: No asymmetric hyperdense vasculature at the skull base. Skull: Intact Other: Mastoid air cells and middle ear cavities are clear. CT MAXILLOFACIAL FINDINGS Osseous: Mild motion artifact noted. No acute facial fracture. No mandibular dislocation. Orbits: Negative. No traumatic or inflammatory finding. Sinuses: Clear. Soft tissues: Negative. CT CERVICAL SPINE FINDINGS Alignment: Normal.  No listhesis. Skull base and vertebrae: Craniocervical alignment is normal. The atlantodental interval is not widened. There is no acute fracture of the cervical spine. 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 No canal hematoma. The spinal canal is otherwise widely patent. No prevertebral soft tissue swelling. No pathologic adenopathy within the neck. No paraspinal fluid collections identified. Disc levels: There is mild intervertebral disc calcifications and posterior disc bulges noted at C3-C7 in keeping with changes of mild degenerative disc disease. Intervertebral disc height is preserved. Vertebral body height is preserved. The prevertebral soft tissues are not  thickened on sagittal reformats. 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 Upper chest: Unremarkable Other: None IMPRESSION: No acute intracranial injury.  No calvarial fracture. No acute facial fracture. No acute fracture or listhesis the cervical spine. Electronically Signed   By: Fidela Salisbury MD   On: 05/31/2021 21:03    Labs:  CBC: Recent Labs    05/31/21 1711 06/01/21 0556  WBC 14.4* 13.6*  HGB 10.4* 9.5*  HCT 31.2* 30.4*  PLT 103* 112*    COAGS: Recent Labs    05/31/21 1711  INR 1.5*  APTT 38*    BMP: Recent Labs    05/31/21 1711 06/01/21 0556  NA 134* 134*  K 3.7 3.3*  CL 92* 98  CO2 28 23  GLUCOSE 102* 111*  BUN 21 17  CALCIUM 8.8* 8.2*  CREATININE 0.71 0.53  GFRNONAA >60 >60    LIVER FUNCTION TESTS: Recent Labs    05/31/21 1711 06/01/21 0556  BILITOT 2.5* 2.6*  AST 133* 110*  ALT 45* 41  ALKPHOS 814* 565*  PROT 6.9 6.0*  ALBUMIN 2.6* 2.3*    TUMOR MARKERS: No results for input(s): AFPTM, CEA, CA199, CHROMGRNA in the last 8760 hours.  Assessment and Plan:  Multiple liver lesions c/w metastatic disease of unknown primary.  Images reviewed by Dr. Dwaine Gale.  Will proceed with image guided biopsy tomorrow by Dr. Earleen Newport.  Risks and benefits of liver lesion was discussed with the patient and/or patient's family including, but not limited to bleeding, infection, damage to adjacent structures or low yield requiring additional tests.  All of the questions were answered and there is agreement to proceed.  Consent signed and in chart.  *She is on Heparin drip for  PE. A member of the IR staff will call the RN in the morning and instruct when to stop the Heparin prior to procedure.  I will order another INR in the am to make sure it remains stable given hepatic disease.  Thank you for this interesting consult.  I greatly enjoyed meeting  Anne West and look forward to participating in their care.  A copy of this report was sent to the requesting provider on this date.  Electronically Signed: Murrell Redden, PA-C   06/01/2021, 1:12 PM      I spent a total of 40 Minutes in face to face in clinical consultation, greater than 50% of which was counseling/coordinating care for liver lesion biopsy.

## 2021-06-01 NOTE — Consult Note (Signed)
[facility] Consult: 9:53 AM 06/01/2021  LOS: 1 day    Referring Provider: Dr  Bonner Puna  Primary Care Physician:  Dr Clayborne Dana at [place].   Primary Gastroenterologist:  Dr at [facility]    Reason for Consultation:  Dysphagia, wt  loss, liver lesions   HPI: Anne West is a 75 y.o. female.  PMH dementia.  PVD.  AAA stent grafting repair, right internal iliac aneurysm embolization.  COPD.  A. fib not on AC.  Hypertension.  Deg spine dx/spinal stenosis, chronic back/leg pain requiring narcotics for management. 2016 subclinical hyperthyroidism.  TSH ok on 01/27/21.  Previous IDA but no longer takes iron. Previous colonoscopy about 5 or 6 years ago at [facility] had a polyp and daughter thinks she is probably due for colonoscopy after 5 years but not certain.  Daughter also thinks she has had EGD but timing of this is uncertain.  01/27/2021 CTA, abdomen pelvis with contrast revealed liver lesions highly suspicious for metastatic disease the largest of these 1.7 cm.  Changes of AAA endograft repair with decreased size of infrarenal AAA measuring 3.9 x 2.9, compared with 4.8 x 4.5 cm on previous exam.  No endoleak.  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  1.9 cm of L internal aortic aneurysm, decreased compared with prior.   Diffuse colon diverticulosis.   Note that CTAP of 12/2019 showed no liver lesions.    Overnight admission 7/19 -05/17/2021 at [place]  Presented with 3 days abdominal pain, 6 months anorexia.  Opioid associated constipation.   T bili 1, alk phos 523, AST/ALT 81/53, lipase 30.  Previously normal LFTs.  Hgb 10.8, MCV 87, Platelets 123. CTAP/angio showed  thickened gallbladder wall, heterogenous liver and multiple liver lesions..  Abdominal ultrasound negative for cholelithiasis or cholecystitis.    Patient ultimately had bowel movements after MiraLAX, senna, suppositories and enema.  No specific plans outlined in discharge summary regarding liver lesions.  No tumor markers obtained.  Discharged on daily MiraLAX, bid Senokot.  Brought to the ED yesterday afternoon with slurred speech, frequent falls, confusion as well as abdominal pain, nausea without vomiting, anorexia, dysphagia.  The abdominal pain has been present for about 3 weeks and located in lower abdomen.  Since starting MiraLAX daily she still manages to have only a couple of bowel movements per week which are hard.  No bloody stool, no melena.  Previously was able to talk on the phone a lot and was oriented to time, place, person but in the last few days has become confused.  Her falls were related to getting off of the elevated mattress and landing improperly on the floor and falling.  The second fall occurred when she tried to sit on the foot rest of a recliner and it gave way and she fell to the floor, sustained carpet burn on her face.  The swallowing problem has been present for a couple of days  and its located in the throat.  Previously could down multiple pills all at once but now unable to swallow even a pill or 2 at a time.  Temp 100.4, tachycardia 117.  Blood pressure, oxygen sats okay.  CTAP, CT angio chest with bilateral pulmonary emboli.  Innumerable, metastatic liver lesions.  Dilated pulmonary artery C/W pulmonary hypertension.  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 CT head/neck: Unremarkable other than cervical spine arthrosis MRI of brain is ordered. Hgb 9.5, MCV 91.6.  Platelets 103.  WBCs 14.4.  Low iron, TIBC.  Ferritin 1079.  B12 3200.  No folate level. Sodium 134.  Potassium 3.3.  T bili 2.6, alk phos 565, AST/ALT 110/41.  Lives at  home, daughter is a caregiver.  Does not drink alcohol or smoke.    Past Medical History:  Diagnosis Date   A-fib ([place])    COPD (chronic obstructive pulmonary disease) (HCC)    High cholesterol    Hypertension     Past Surgical History:  Procedure Laterality Date   ABDOMINAL HYSTERECTOMY     BACK SURGERY      Prior to Admission medications   Medication Sig Start Date End Date Taking? Authorizing Provider  albuterol (PROVENTIL HFA;VENTOLIN HFA) 108 (90 BASE) MCG/ACT inhaler Inhale 2 puffs into the lungs every 6 (six) hours as needed for wheezing.   Yes [provider]  amLODipine (NORVASC) 5 MG tablet Take 5 mg by mouth daily.   Yes [provider]  aspirin EC 81 MG tablet Take 81 mg by mouth daily. Swallow whole.   Yes [provider]  atorvastatin (LIPITOR) 40 MG tablet Take 40 mg by mouth daily.   Yes [provider]  Cholecalciferol 25 MCG (1000 UT) capsule Take 1,000 Units by mouth daily.   Yes [provider]  DILT-XR 180 MG 24 hr capsule Take 180 mg by mouth daily. 05/29/21  Yes [provider]  fluticasone (FLONASE) 50 MCG/ACT nasal spray Place 1 spray into the nose daily as needed for allergies or rhinitis.   Yes [provider]  gabapentin (NEURONTIN) 800 MG tablet Take 800 mg by mouth in the morning and at bedtime. 04/22/21  Yes [provider]  HYDROcodone-acetaminophen (NORCO) 7.5-325 MG tablet Take 1 tablet by mouth 2 (two) times daily as needed for moderate pain. 11/04/17  Yes [provider]  ibuprofen (ADVIL,MOTRIN) 200 MG tablet Take 400 mg by mouth every 6 (six) hours as needed for headache or moderate pain. For fever and pain   Yes [provider]  Magnesium 250 MG TABS Take 250 mg by mouth daily.   Yes [provider]  metoprolol succinate (TOPROL-XL) 25 MG 24 hr tablet Take 25 mg by mouth daily. 04/18/21  Yes [provider]  polyethylene glycol powder  (GLYCOLAX/MIRALAX) 17 GM/SCOOP powder Take 17 g by mouth daily as needed. 05/17/21  Yes [provider]  POTASSIUM PO Take 1 tablet by mouth daily. OTC   Yes [provider]  rivastigmine (EXELON) 4.5 MG capsule Take 4.5 mg by mouth 2 (two) times daily. 04/22/21  Yes [provider]  TRELEGY ELLIPTA 100-62.5-25 MCG/INH AEPB Take 1 puff by mouth daily. 05/11/21  Yes [provider]  zolpidem (AMBIEN) 5 MG tablet Take 5 mg by mouth at bedtime. 04/20/21  Yes [provider]  hydrOXYzine (ATARAX/VISTARIL) 25 MG tablet Take 1 tablet (25 mg total) by mouth every 6 (six) hours as needed for itching. Patient not  taking: No sig reported 07/26/16   Tanna Furry, MD    Scheduled Meds:  Infusions:  sodium chloride 125 mL/hr at 06/01/21 0821   aztreonam Stopped (06/01/21 0909)   heparin 1,200 Units/hr (06/01/21 0822)   metronidazole Stopped (06/01/21 0450)   vancomycin     PRN Meds: ibuprofen, zolpidem   Allergies as of 05/31/2021 - Review Complete 05/31/2021  Allergen Reaction Noted   Donepezil Itching 02/07/2019   Penicillins cross reactors  09/17/2011   Sulfa antibiotics  09/17/2011    History reviewed. No pertinent family history.  Social History   Socioeconomic History   Marital status: Divorced    Spouse name: Not on file   Number of children: Not on file   Years of education: Not on file   Highest education level: Not on file  Occupational History   Not on file  Tobacco Use   Smoking status: Every Day    Types: Cigarettes   Smokeless tobacco: Never  Substance and Sexual Activity   Alcohol use: No   Drug use: No   Sexual activity: Not on file  Other Topics Concern   Not on file  Social History Narrative   Not on file   Social Determinants of Health   Financial Resource Strain: Not on file  Food Insecurity: Not on file  Transportation Needs: Not on file  Physical Activity: Not on file  Stress: Not on file  Social Connections:  Not on file  Intimate Partner Violence: Not on file    REVIEW OF SYSTEMS: Constitutional: Weakness. ENT:  No nose bleeds.  Normally wears full set of dentures but these are at home. Pulm: No cough or dyspnea. CV:  No palpitations, no LE edema.  Angina, chest pain. GU:  No hematuria, no frequency GI: See HPI. Heme: Denies unusual bleeding or bruising. Transfusions: No previous blood product transfusions. Neuro: Altered speech, but altered mental status as per HPI.  No headaches, no peripheral tingling or numbness Derm:  No itching, no rash or sores.  Endocrine:  No sweats or chills.  No polyuria or dysuria Immunization: Not queried. Travel:  None beyond local counties in last few months.    PHYSICAL EXAM: Vital signs in last 24 hours: Vitals:   06/01/21 0700 06/01/21 0930  BP: (!) 140/52 (!) 124/49  Pulse: 92 89  Resp:  16  Temp:  98.7 F (37.1 C)  SpO2: 90% 95%   Wt Readings from Last 3 Encounters:  05/31/21 59 kg  07/26/16 82.6 kg  09/17/11 85.7 kg    General: Pleasant, somewhat frail-appearing but comfortable and alert.  Most of the history was obtained speaking with patient's daughter. Head: Multiple small nonbleeding, scabbed over abrasions on her forehead and face. Eyes: No conjunctival pallor.  No scleral icterus.  EOMI Ears: Not hard of hearing Nose: No congestion or discharge Mouth: Mucosa moist, pink, clear.  Tongue midline.  Edentulous. Neck: No JVD, no masses, no thyromegaly Lungs: Diminished breath sounds in the right base.  No labored breathing, no adventitious sounds.  No cough. Heart: RRR.  No MRG.  S1, S2 present Abdomen: Soft.  Fullness without distinct mass in the right upper quadrant which is not tender.  Active bowel sounds without distention.  No HSM, bruits, hernias..   Rectal: Patient became agitated as I tried to perform DRE so this was aborted.  However there was a lot of incontinent, dark brown, soft stool which samples trace FOBT positive.  No  visible blood.  Unable to  get a good visualization of her rectum. Musc/Skeltl: Limbs somewhat thin without gross deformity or joint swelling. Extremities: No CCE. Neurologic: Follows commands.  Oriented to place, self but not currently to year.  Moves all 4 limbs, no tremor.  Strength not tested. Skin: Carpet rash on her forehead otherwise no suspicious lesions, rash, purpura or bruising. Nodes: No cervical adenopathy Psych: Calm, cooperative.  Pleasant affect.  Intake/Output from previous day: 08/03 0701 - 08/04 0700 In: 200 [IV Piggyback:200] Out: -  Intake/Output this shift: Total I/O In: 87.4 [IV Piggyback:87.4] Out: -   LAB RESULTS: Recent Labs    05/31/21 1711 06/01/21 0556  WBC 14.4* 13.6*  HGB 10.4* 9.5*  HCT 31.2* 30.4*  PLT 103* 112*   BMET Lab Results  Component Value Date   NA 134 (L) 06/01/2021   NA 134 (L) 05/31/2021   NA 138 04/06/2013   K 3.3 (L) 06/01/2021   K 3.7 05/31/2021   K 3.7 04/06/2013   CL 98 06/01/2021   CL 92 (L) 05/31/2021   CL 97 04/06/2013   CO2 23 06/01/2021   CO2 28 05/31/2021   CO2 28 04/06/2013   GLUCOSE 111 (H) 06/01/2021   GLUCOSE 102 (H) 05/31/2021   GLUCOSE 146 (H) 04/06/2013   BUN 17 06/01/2021   BUN 21 05/31/2021   BUN 12 04/06/2013   CREATININE 0.53 06/01/2021   CREATININE 0.71 05/31/2021   CREATININE 0.80 04/06/2013   CALCIUM 8.2 (L) 06/01/2021   CALCIUM 8.8 (L) 05/31/2021   CALCIUM 9.8 04/06/2013   LFT Recent Labs    05/31/21 1711 06/01/21 0556  PROT 6.9 6.0*  ALBUMIN 2.6* 2.3*  AST 133* 110*  ALT 45* 41  ALKPHOS 814* 565*  BILITOT 2.5* 2.6*   PT/INR Lab Results  Component Value Date   INR 1.5 (H) 05/31/2021   Hepatitis Panel No results for input(s): HEPBSAG, HCVAB, HEPAIGM, HEPBIGM in the last 72 hours. C-Diff No components found for: CDIFF Lipase  No results found for: LIPASE  Drugs of Abuse  No results found for: LABOPIA, COCAINSCRNUR, LABBENZ, AMPHETMU, THCU, LABBARB   RADIOLOGY  STUDIES: DG Chest 1 View  Result Date: 05/31/2021 CLINICAL DATA:  Multiple falls, slurred speech, abdominal pain EXAM: CHEST  1 VIEW COMPARISON:  05/16/2021 FINDINGS: Single frontal view of the chest demonstrates an unremarkable cardiac silhouette. No acute airspace disease, effusion, or pneumothorax. Chronic interstitial scarring is noted. No acute bony abnormalities. Partial visualization of aortic stent. IMPRESSION: 1. No acute intrathoracic process. Electronically Signed   By: Randa Ngo M.D.   On: 05/31/2021 18:28   CT HEAD WO CONTRAST (5MM)  Result Date: 05/31/2021 CLINICAL DATA:  Mental status change, unknown cause; Neck trauma (Age >= 65y); Facial trauma. Increased confusion, slurred speech and multiple falls over the past week. EXAM: CT HEAD WITHOUT CONTRAST CT MAXILLOFACIAL WITHOUT CONTRAST CT CERVICAL SPINE WITHOUT CONTRAST TECHNIQUE: Multidetector CT imaging of the head, cervical spine, and maxillofacial structures were performed using the standard protocol without intravenous contrast. Multiplanar CT image reconstructions of the cervical spine and maxillofacial structures were also generated. COMPARISON:  None. FINDINGS: CT HEAD FINDINGS Brain: Normal anatomic configuration. Parenchymal volume loss is commensurate with the patient's age. Moderate periventricular white matter changes are present likely reflecting the sequela of small vessel ischemia. Remote lacunar infarct versus dilated perivascular space within the right basal ganglia. No abnormal intra or extra-axial mass lesion or fluid collection. No abnormal mass effect or midline shift. No evidence of acute intracranial hemorrhage or infarct.  Ventricular size is normal. Cerebellum unremarkable. Vascular: No asymmetric hyperdense vasculature at the skull base. Skull: Intact Other: Mastoid air cells and middle ear cavities are clear. CT MAXILLOFACIAL FINDINGS Osseous: Mild motion artifact noted. No acute facial fracture. No mandibular  dislocation. Orbits: Negative. No traumatic or inflammatory finding. Sinuses: Clear. Soft tissues: Negative. CT CERVICAL SPINE FINDINGS Alignment: Normal.  No listhesis. Skull base and vertebrae: Craniocervical alignment is normal. The atlantodental interval is not widened. There is no acute fracture of the cervical spine. 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 No canal hematoma. The spinal canal is otherwise widely patent. No prevertebral soft tissue swelling. No pathologic adenopathy within the neck. No paraspinal fluid collections identified. Disc levels: There is mild intervertebral disc calcifications and posterior disc bulges noted at C3-C7 in keeping with changes of mild degenerative disc disease. Intervertebral disc height is preserved. Vertebral body height is preserved. The prevertebral soft tissues are not thickened on sagittal reformats. 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 Upper chest: Unremarkable Other: None IMPRESSION: No acute intracranial injury.  No calvarial fracture. No acute facial fracture. No acute fracture or listhesis the cervical spine. Electronically Signed   By: Fidela Salisbury MD   On: 05/31/2021 21:03   CT Angio Chest PE W and/or Wo Contrast  Result Date: 05/31/2021 CLINICAL DATA:  Abdominal pain, elevated LFTs and encephalopathy EXAM: CT ANGIOGRAPHY CHEST CT ABDOMEN AND PELVIS WITH CONTRAST TECHNIQUE: Multidetector CT imaging of the chest was performed using the standard protocol during bolus administration of intravenous contrast. Multiplanar CT image reconstructions and MIPs were obtained to evaluate the vascular anatomy. Multidetector CT imaging of the abdomen and pelvis was performed using the standard protocol during bolus administration  of intravenous contrast. CONTRAST:  178m OMNIPAQUE IOHEXOL 350 MG/ML SOLN COMPARISON:  None. FINDINGS: CTA CHEST FINDINGS Cardiovascular: Contrast injection is sufficient to demonstrate satisfactory opacification of the pulmonary arteries to the segmental level.There are small segmental pulmonary emboli within the right upper, middle and lower lobes. On the left, there is a segmental embolus in the left upper lobe. The main pulmonary artery is enlarged, measuring 4.1 cm. There is no CT evidence of acute right heart strain. There is moderate aortic atherosclerosis. There is a normal 3-vessel arch branching pattern. Heart size is normal. No pericardial effusion. There are coronary artery calcifications. Mediastinum/Nodes: No mediastinal, hilar or axillary lymphadenopathy. The visualized thyroid and thoracic esophageal course are unremarkable. Lungs/Pleura: No pulmonary nodules or masses. No pleural effusion or pneumothorax. No focal airspace consolidation. No focal pleural abnormality. Musculoskeletal: No chest wall abnormality. No acute or significant osseous findings. Review of the MIP images confirms the above findings. CT ABDOMEN and PELVIS FINDINGS Hepatobiliary: Innumerable lesions throughout the liver. The largest lesion is in the right hepatic lobe and measures approximately 5.7 cm. Mild gallbladder wall thickening Pancreas: Pancreatic duct is dilated in the distal body and tail. The tail is atrophic. Spleen: Normal. Adrenals/Urinary Tract: --Adrenal glands: Normal. --Right kidney/ureter: No hydronephrosis or perinephric stranding. No nephrolithiasis. No obstructing ureteral stones. --Left kidney/ureter: No hydronephrosis or perinephric stranding. No nephrolithiasis. No obstructing ureteral stones. --Urinary bladder: Unremarkable. Stomach/Bowel: --Stomach/Duodenum: No hiatal hernia or other gastric abnormality. Normal duodenal course and caliber. --Small bowel: No dilatation or inflammation. --Colon: No focal  abnormality. --Appendix: Not visualized. No right lower quadrant inflammation or  free fluid. Vascular/Lymphatic: Aortic atherosclerosis. Aorta bi-iliac stent is patent. 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 No abdominal or pelvic lymphadenopathy. Reproductive: Status post hysterectomy. No adnexal mass. Small volume free fluid in the pelvis. Musculoskeletal. No bony spinal canal stenosis or focal osseous abnormality. Other: None. IMPRESSION: 1. Small segmental pulmonary emboli within the right upper, middle and lower lobes and left upper lobe. No CT evidence of acute right heart strain. 2. Innumerable liver lesions, consistent with metastatic disease. 3. Dilated main pulmonary artery, consistent with pulmonary hypertension. 4. 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 Critical Value/emergent results were called by telephone at the time of interpretation on 05/31/2021 at 9:10 pm to provider DAVID YAO , who verbally acknowledged these results. Aortic Atherosclerosis (ICD10-I70.0). Electronically Signed   By: Ulyses Jarred M.D.   On: 05/31/2021 21:11   CT Cervical Spine Wo Contrast  Result Date: 05/31/2021 CLINICAL DATA:  Mental status change, unknown cause; Neck trauma (Age >= 65y); Facial trauma. Increased confusion, slurred speech and multiple falls over the past week. EXAM: CT HEAD WITHOUT CONTRAST CT MAXILLOFACIAL WITHOUT CONTRAST CT CERVICAL SPINE WITHOUT CONTRAST TECHNIQUE: Multidetector CT imaging of the head, cervical spine, and maxillofacial structures were performed using the standard protocol without intravenous contrast. Multiplanar CT image reconstructions of the cervical spine and maxillofacial structures were also generated. COMPARISON:  None. FINDINGS: CT HEAD FINDINGS Brain: Normal anatomic configuration. Parenchymal volume  loss is commensurate with the patient's age. Moderate periventricular white matter changes are present likely reflecting the sequela of small vessel ischemia. Remote lacunar infarct versus dilated perivascular space within the right basal ganglia. No abnormal intra or extra-axial mass lesion or fluid collection. No abnormal mass effect or midline shift. No evidence of acute intracranial hemorrhage or infarct. Ventricular size is normal. Cerebellum unremarkable. Vascular: No asymmetric hyperdense vasculature at the skull base. Skull: Intact Other: Mastoid air cells and middle ear cavities are clear. CT MAXILLOFACIAL FINDINGS Osseous: Mild motion artifact noted. No acute facial fracture. No mandibular dislocation. Orbits: Negative. No traumatic or inflammatory finding. Sinuses: Clear. Soft tissues: Negative. CT CERVICAL SPINE FINDINGS Alignment: Normal.  No listhesis. Skull base and vertebrae: Craniocervical alignment is normal. The atlantodental interval is not widened. There is no acute fracture of the cervical spine. 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 No canal hematoma. The spinal canal is otherwise widely patent. No prevertebral soft tissue swelling. No pathologic adenopathy within the neck. No paraspinal fluid collections identified. Disc levels: There is mild intervertebral disc calcifications and posterior disc bulges noted at C3-C7 in keeping with changes of mild degenerative disc disease. Intervertebral disc height is preserved. Vertebral body height is preserved. The prevertebral soft tissues are not thickened on sagittal reformats. 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 Upper chest: Unremarkable Other: None IMPRESSION: No acute intracranial injury.  No calvarial  fracture. No acute facial fracture. No acute fracture or listhesis the cervical spine. Electronically Signed   By: Fidela Salisbury MD   On: 05/31/2021 21:03   CT ABDOMEN PELVIS W CONTRAST  Result Date: 05/31/2021 CLINICAL DATA:  Abdominal pain, elevated LFTs and encephalopathy EXAM: CT ANGIOGRAPHY CHEST CT ABDOMEN AND PELVIS WITH CONTRAST  TECHNIQUE: Multidetector CT imaging of the chest was performed using the standard protocol during bolus administration of intravenous contrast. Multiplanar CT image reconstructions and MIPs were obtained to evaluate the vascular anatomy. Multidetector CT imaging of the abdomen and pelvis was performed using the standard protocol during bolus administration of intravenous contrast. CONTRAST:  139m OMNIPAQUE IOHEXOL 350 MG/ML SOLN COMPARISON:  None. FINDINGS: CTA CHEST FINDINGS Cardiovascular: Contrast injection is sufficient to demonstrate satisfactory opacification of the pulmonary arteries to the segmental level.There are small segmental pulmonary emboli within the right upper, middle and lower lobes. On the left, there is a segmental embolus in the left upper lobe. The main pulmonary artery is enlarged, measuring 4.1 cm. There is no CT evidence of acute right heart strain. There is moderate aortic atherosclerosis. There is a normal 3-vessel arch branching pattern. Heart size is normal. No pericardial effusion. There are coronary artery calcifications. Mediastinum/Nodes: No mediastinal, hilar or axillary lymphadenopathy. The visualized thyroid and thoracic esophageal course are unremarkable. Lungs/Pleura: No pulmonary nodules or masses. No pleural effusion or pneumothorax. No focal airspace consolidation. No focal pleural abnormality. Musculoskeletal: No chest wall abnormality. No acute or significant osseous findings. Review of the MIP images confirms the above findings. CT ABDOMEN and PELVIS FINDINGS Hepatobiliary: Innumerable lesions throughout the liver. The largest lesion  is in the right hepatic lobe and measures approximately 5.7 cm. Mild gallbladder wall thickening Pancreas: Pancreatic duct is dilated in the distal body and tail. The tail is atrophic. Spleen: Normal. Adrenals/Urinary Tract: --Adrenal glands: Normal. --Right kidney/ureter: No hydronephrosis or perinephric stranding. No nephrolithiasis. No obstructing ureteral stones. --Left kidney/ureter: No hydronephrosis or perinephric stranding. No nephrolithiasis. No obstructing ureteral stones. --Urinary bladder: Unremarkable. Stomach/Bowel: --Stomach/Duodenum: No hiatal hernia or other gastric abnormality. Normal duodenal course and caliber. --Small bowel: No dilatation or inflammation. --Colon: No focal abnormality. --Appendix: Not visualized. No right lower quadrant inflammation or free fluid. Vascular/Lymphatic: Aortic atherosclerosis. Aorta bi-iliac stent is patent. 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 No abdominal or pelvic lymphadenopathy. Reproductive: Status post hysterectomy. No adnexal mass. Small volume free fluid in the pelvis. Musculoskeletal. No bony spinal canal stenosis or focal osseous abnormality. Other: None. IMPRESSION: 1. Small segmental pulmonary emboli within the right upper, middle and lower lobes and left upper lobe. No CT evidence of acute right heart strain. 2. Innumerable liver lesions, consistent with metastatic disease. 3. Dilated main pulmonary artery, consistent with pulmonary hypertension. 4. 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 Critical Value/emergent results were called by telephone at the time of interpretation on 05/31/2021 at 9:10 pm to provider DAVID YAO , who verbally acknowledged these results. Aortic Atherosclerosis (ICD10-I70.0). Electronically Signed   By: KUlyses JarredM.D.   On: 05/31/2021 21:11   CT  Maxillofacial Wo Contrast  Result Date: 05/31/2021 CLINICAL DATA:  Mental status change, unknown cause; Neck trauma (Age >= 65y); Facial trauma. Increased confusion, slurred speech and multiple falls over the past week. EXAM: CT HEAD WITHOUT CONTRAST CT MAXILLOFACIAL WITHOUT CONTRAST CT CERVICAL SPINE WITHOUT CONTRAST TECHNIQUE: Multidetector CT imaging of the head, cervical spine, and maxillofacial structures were performed using the standard protocol without intravenous contrast. Multiplanar CT image reconstructions of the cervical spine and maxillofacial structures were also generated. COMPARISON:  None. FINDINGS: CT HEAD FINDINGS Brain: Normal anatomic configuration. Parenchymal volume loss is commensurate with the patient's age. Moderate periventricular white  matter changes are present likely reflecting the sequela of small vessel ischemia. Remote lacunar infarct versus dilated perivascular space within the right basal ganglia. No abnormal intra or extra-axial mass lesion or fluid collection. No abnormal mass effect or midline shift. No evidence of acute intracranial hemorrhage or infarct. Ventricular size is normal. Cerebellum unremarkable. Vascular: No asymmetric hyperdense vasculature at the skull base. Skull: Intact Other: Mastoid air cells and middle ear cavities are clear. CT MAXILLOFACIAL FINDINGS Osseous: Mild motion artifact noted. No acute facial fracture. No mandibular dislocation. Orbits: Negative. No traumatic or inflammatory finding. Sinuses: Clear. Soft tissues: Negative. CT CERVICAL SPINE FINDINGS Alignment: Normal.  No listhesis. Skull base and vertebrae: Craniocervical alignment is normal. The atlantodental interval is not widened. There is no acute fracture of the cervical spine. 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 No canal hematoma. The spinal canal is otherwise  widely patent. No prevertebral soft tissue swelling. No pathologic adenopathy within the neck. No paraspinal fluid collections identified. Disc levels: There is mild intervertebral disc calcifications and posterior disc bulges noted at C3-C7 in keeping with changes of mild degenerative disc disease. Intervertebral disc height is preserved. Vertebral body height is preserved. The prevertebral soft tissues are not thickened on sagittal reformats. 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 Upper chest: Unremarkable Other: None IMPRESSION: No acute intracranial injury.  No calvarial fracture. No acute facial fracture. No acute fracture or listhesis the cervical spine. Electronically Signed   By: Fidela Salisbury MD   On: 05/31/2021 21:03     IMPRESSION:      Dysphagia.  New onset.       Acute PE, started on heparin.  Patient has no so subjective respiratory symptoms and no hypoxia  FOBT positive stool.     Normocytic anemia which looks like iron deficiency.  History of iron deficiency in the past.     Multiple metastatic liver lesions.  Have not been worked up.  IR consulted for ultrasound-guided liver biopsy.    Fever.  WBCs 14.4.     Thrombocytopenia.  No splenomegaly on imaging.      PLAN:     Hopefully IR will find a target lesion for biopsy.     Plan EGD 11 AM tmrw with possible esophageal dilatation tomorrow.  Would need to be off heparin for a few hours if dilation to be performed.  Alternatively we could perform just a diagnostic EGD.  Ordered multiple tumor markers to be obtained today.  Ok for full liquid diet, npo after mn   Azucena Freed  06/01/2021, 9:53 AM Phone [phone]

## 2021-06-01 NOTE — H&P (View-Only) (Signed)
[facility] Consult: 9:53 AM 06/01/2021  LOS: 1 day    Referring Provider: Dr  Bonner Puna  Primary Care Physician:  Dr Clayborne Dana at [place].   Primary Gastroenterologist:  Dr at [facility]    Reason for Consultation:  Dysphagia, wt  loss, liver lesions   HPI: Anne West is a 75 y.o. female.  PMH dementia.  PVD.  AAA stent grafting repair, right internal iliac aneurysm embolization.  COPD.  A. fib not on AC.  Hypertension.  Deg spine dx/spinal stenosis, chronic back/leg pain requiring narcotics for management. 2016 subclinical hyperthyroidism.  TSH ok on 01/27/21.  Previous IDA but no longer takes iron. Previous colonoscopy about 5 or 6 years ago at [facility] had a polyp and daughter thinks she is probably due for colonoscopy after 5 years but not certain.  Daughter also thinks she has had EGD but timing of this is uncertain.  01/27/2021 CTA, abdomen pelvis with contrast revealed liver lesions highly suspicious for metastatic disease the largest of these 1.7 cm.  Changes of AAA endograft repair with decreased size of infrarenal AAA measuring 3.9 x 2.9, compared with 4.8 x 4.5 cm on previous exam.  No endoleak.  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  1.9 cm of L internal aortic aneurysm, decreased compared with prior.   Diffuse colon diverticulosis.   Note that CTAP of 12/2019 showed no liver lesions.    Overnight admission 7/19 -05/17/2021 at [place]  Presented with 3 days abdominal pain, 6 months anorexia.  Opioid associated constipation.   T bili 1, alk phos 523, AST/ALT 81/53, lipase 30.  Previously normal LFTs.  Hgb 10.8, MCV 87, Platelets 123. CTAP/angio showed  thickened gallbladder wall, heterogenous liver and multiple liver lesions..  Abdominal ultrasound negative for cholelithiasis or cholecystitis.    Patient ultimately had bowel movements after MiraLAX, senna, suppositories and enema.  No specific plans outlined in discharge summary regarding liver lesions.  No tumor markers obtained.  Discharged on daily MiraLAX, bid Senokot.  Brought to the ED yesterday afternoon with slurred speech, frequent falls, confusion as well as abdominal pain, nausea without vomiting, anorexia, dysphagia.  The abdominal pain has been present for about 3 weeks and located in lower abdomen.  Since starting MiraLAX daily she still manages to have only a couple of bowel movements per week which are hard.  No bloody stool, no melena.  Previously was able to talk on the phone a lot and was oriented to time, place, person but in the last few days has become confused.  Her falls were related to getting off of the elevated mattress and landing improperly on the floor and falling.  The second fall occurred when she tried to sit on the foot rest of a recliner and it gave way and she fell to the floor, sustained carpet burn on her face.  The swallowing problem has been present for a couple of days  and its located in the throat.  Previously could down multiple pills all at once but now unable to swallow even a pill or 2 at a time.  Temp 100.4, tachycardia 117.  Blood pressure, oxygen sats okay.  CTAP, CT angio chest with bilateral pulmonary emboli.  Innumerable, metastatic liver lesions.  Dilated pulmonary artery C/W pulmonary hypertension.  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 CT head/neck: Unremarkable other than cervical spine arthrosis MRI of brain is ordered. Hgb 9.5, MCV 91.6.  Platelets 103.  WBCs 14.4.  Low iron, TIBC.  Ferritin 1079.  B12 3200.  No folate level. Sodium 134.  Potassium 3.3.  T bili 2.6, alk phos 565, AST/ALT 110/41.  Lives at  home, daughter is a caregiver.  Does not drink alcohol or smoke.    Past Medical History:  Diagnosis Date   A-fib ([place])    COPD (chronic obstructive pulmonary disease) (HCC)    High cholesterol    Hypertension     Past Surgical History:  Procedure Laterality Date   ABDOMINAL HYSTERECTOMY     BACK SURGERY      Prior to Admission medications   Medication Sig Start Date End Date Taking? Authorizing Provider  albuterol (PROVENTIL HFA;VENTOLIN HFA) 108 (90 BASE) MCG/ACT inhaler Inhale 2 puffs into the lungs every 6 (six) hours as needed for wheezing.   Yes [provider]  amLODipine (NORVASC) 5 MG tablet Take 5 mg by mouth daily.   Yes [provider]  aspirin EC 81 MG tablet Take 81 mg by mouth daily. Swallow whole.   Yes [provider]  atorvastatin (LIPITOR) 40 MG tablet Take 40 mg by mouth daily.   Yes [provider]  Cholecalciferol 25 MCG (1000 UT) capsule Take 1,000 Units by mouth daily.   Yes [provider]  DILT-XR 180 MG 24 hr capsule Take 180 mg by mouth daily. 05/29/21  Yes [provider]  fluticasone (FLONASE) 50 MCG/ACT nasal spray Place 1 spray into the nose daily as needed for allergies or rhinitis.   Yes [provider]  gabapentin (NEURONTIN) 800 MG tablet Take 800 mg by mouth in the morning and at bedtime. 04/22/21  Yes [provider]  HYDROcodone-acetaminophen (NORCO) 7.5-325 MG tablet Take 1 tablet by mouth 2 (two) times daily as needed for moderate pain. 11/04/17  Yes [provider]  ibuprofen (ADVIL,MOTRIN) 200 MG tablet Take 400 mg by mouth every 6 (six) hours as needed for headache or moderate pain. For fever and pain   Yes [provider]  Magnesium 250 MG TABS Take 250 mg by mouth daily.   Yes [provider]  metoprolol succinate (TOPROL-XL) 25 MG 24 hr tablet Take 25 mg by mouth daily. 04/18/21  Yes [provider]  polyethylene glycol powder  (GLYCOLAX/MIRALAX) 17 GM/SCOOP powder Take 17 g by mouth daily as needed. 05/17/21  Yes [provider]  POTASSIUM PO Take 1 tablet by mouth daily. OTC   Yes [provider]  rivastigmine (EXELON) 4.5 MG capsule Take 4.5 mg by mouth 2 (two) times daily. 04/22/21  Yes [provider]  TRELEGY ELLIPTA 100-62.5-25 MCG/INH AEPB Take 1 puff by mouth daily. 05/11/21  Yes [provider]  zolpidem (AMBIEN) 5 MG tablet Take 5 mg by mouth at bedtime. 04/20/21  Yes [provider]  hydrOXYzine (ATARAX/VISTARIL) 25 MG tablet Take 1 tablet (25 mg total) by mouth every 6 (six) hours as needed for itching. Patient not  taking: No sig reported 07/26/16   Tanna Furry, MD    Scheduled Meds:  Infusions:  sodium chloride 125 mL/hr at 06/01/21 0821   aztreonam Stopped (06/01/21 0909)   heparin 1,200 Units/hr (06/01/21 0822)   metronidazole Stopped (06/01/21 0450)   vancomycin     PRN Meds: ibuprofen, zolpidem   Allergies as of 05/31/2021 - Review Complete 05/31/2021  Allergen Reaction Noted   Donepezil Itching 02/07/2019   Penicillins cross reactors  09/17/2011   Sulfa antibiotics  09/17/2011    History reviewed. No pertinent family history.  Social History   Socioeconomic History   Marital status: Divorced    Spouse name: Not on file   Number of children: Not on file   Years of education: Not on file   Highest education level: Not on file  Occupational History   Not on file  Tobacco Use   Smoking status: Every Day    Types: Cigarettes   Smokeless tobacco: Never  Substance and Sexual Activity   Alcohol use: No   Drug use: No   Sexual activity: Not on file  Other Topics Concern   Not on file  Social History Narrative   Not on file   Social Determinants of Health   Financial Resource Strain: Not on file  Food Insecurity: Not on file  Transportation Needs: Not on file  Physical Activity: Not on file  Stress: Not on file  Social Connections:  Not on file  Intimate Partner Violence: Not on file    REVIEW OF SYSTEMS: Constitutional: Weakness. ENT:  No nose bleeds.  Normally wears full set of dentures but these are at home. Pulm: No cough or dyspnea. CV:  No palpitations, no LE edema.  Angina, chest pain. GU:  No hematuria, no frequency GI: See HPI. Heme: Denies unusual bleeding or bruising. Transfusions: No previous blood product transfusions. Neuro: Altered speech, but altered mental status as per HPI.  No headaches, no peripheral tingling or numbness Derm:  No itching, no rash or sores.  Endocrine:  No sweats or chills.  No polyuria or dysuria Immunization: Not queried. Travel:  None beyond local counties in last few months.    PHYSICAL EXAM: Vital signs in last 24 hours: Vitals:   06/01/21 0700 06/01/21 0930  BP: (!) 140/52 (!) 124/49  Pulse: 92 89  Resp:  16  Temp:  98.7 F (37.1 C)  SpO2: 90% 95%   Wt Readings from Last 3 Encounters:  05/31/21 59 kg  07/26/16 82.6 kg  09/17/11 85.7 kg    General: Pleasant, somewhat frail-appearing but comfortable and alert.  Most of the history was obtained speaking with patient's daughter. Head: Multiple small nonbleeding, scabbed over abrasions on her forehead and face. Eyes: No conjunctival pallor.  No scleral icterus.  EOMI Ears: Not hard of hearing Nose: No congestion or discharge Mouth: Mucosa moist, pink, clear.  Tongue midline.  Edentulous. Neck: No JVD, no masses, no thyromegaly Lungs: Diminished breath sounds in the right base.  No labored breathing, no adventitious sounds.  No cough. Heart: RRR.  No MRG.  S1, S2 present Abdomen: Soft.  Fullness without distinct mass in the right upper quadrant which is not tender.  Active bowel sounds without distention.  No HSM, bruits, hernias..   Rectal: Patient became agitated as I tried to perform DRE so this was aborted.  However there was a lot of incontinent, dark brown, soft stool which samples trace FOBT positive.  No  visible blood.  Unable to  get a good visualization of her rectum. Musc/Skeltl: Limbs somewhat thin without gross deformity or joint swelling. Extremities: No CCE. Neurologic: Follows commands.  Oriented to place, self but not currently to year.  Moves all 4 limbs, no tremor.  Strength not tested. Skin: Carpet rash on her forehead otherwise no suspicious lesions, rash, purpura or bruising. Nodes: No cervical adenopathy Psych: Calm, cooperative.  Pleasant affect.  Intake/Output from previous day: 08/03 0701 - 08/04 0700 In: 200 [IV Piggyback:200] Out: -  Intake/Output this shift: Total I/O In: 87.4 [IV Piggyback:87.4] Out: -   LAB RESULTS: Recent Labs    05/31/21 1711 06/01/21 0556  WBC 14.4* 13.6*  HGB 10.4* 9.5*  HCT 31.2* 30.4*  PLT 103* 112*   BMET Lab Results  Component Value Date   NA 134 (L) 06/01/2021   NA 134 (L) 05/31/2021   NA 138 04/06/2013   K 3.3 (L) 06/01/2021   K 3.7 05/31/2021   K 3.7 04/06/2013   CL 98 06/01/2021   CL 92 (L) 05/31/2021   CL 97 04/06/2013   CO2 23 06/01/2021   CO2 28 05/31/2021   CO2 28 04/06/2013   GLUCOSE 111 (H) 06/01/2021   GLUCOSE 102 (H) 05/31/2021   GLUCOSE 146 (H) 04/06/2013   BUN 17 06/01/2021   BUN 21 05/31/2021   BUN 12 04/06/2013   CREATININE 0.53 06/01/2021   CREATININE 0.71 05/31/2021   CREATININE 0.80 04/06/2013   CALCIUM 8.2 (L) 06/01/2021   CALCIUM 8.8 (L) 05/31/2021   CALCIUM 9.8 04/06/2013   LFT Recent Labs    05/31/21 1711 06/01/21 0556  PROT 6.9 6.0*  ALBUMIN 2.6* 2.3*  AST 133* 110*  ALT 45* 41  ALKPHOS 814* 565*  BILITOT 2.5* 2.6*   PT/INR Lab Results  Component Value Date   INR 1.5 (H) 05/31/2021   Hepatitis Panel No results for input(s): HEPBSAG, HCVAB, HEPAIGM, HEPBIGM in the last 72 hours. C-Diff No components found for: CDIFF Lipase  No results found for: LIPASE  Drugs of Abuse  No results found for: LABOPIA, COCAINSCRNUR, LABBENZ, AMPHETMU, THCU, LABBARB   RADIOLOGY  STUDIES: DG Chest 1 View  Result Date: 05/31/2021 CLINICAL DATA:  Multiple falls, slurred speech, abdominal pain EXAM: CHEST  1 VIEW COMPARISON:  05/16/2021 FINDINGS: Single frontal view of the chest demonstrates an unremarkable cardiac silhouette. No acute airspace disease, effusion, or pneumothorax. Chronic interstitial scarring is noted. No acute bony abnormalities. Partial visualization of aortic stent. IMPRESSION: 1. No acute intrathoracic process. Electronically Signed   By: Randa Ngo M.D.   On: 05/31/2021 18:28   CT HEAD WO CONTRAST (5MM)  Result Date: 05/31/2021 CLINICAL DATA:  Mental status change, unknown cause; Neck trauma (Age >= 65y); Facial trauma. Increased confusion, slurred speech and multiple falls over the past week. EXAM: CT HEAD WITHOUT CONTRAST CT MAXILLOFACIAL WITHOUT CONTRAST CT CERVICAL SPINE WITHOUT CONTRAST TECHNIQUE: Multidetector CT imaging of the head, cervical spine, and maxillofacial structures were performed using the standard protocol without intravenous contrast. Multiplanar CT image reconstructions of the cervical spine and maxillofacial structures were also generated. COMPARISON:  None. FINDINGS: CT HEAD FINDINGS Brain: Normal anatomic configuration. Parenchymal volume loss is commensurate with the patient's age. Moderate periventricular white matter changes are present likely reflecting the sequela of small vessel ischemia. Remote lacunar infarct versus dilated perivascular space within the right basal ganglia. No abnormal intra or extra-axial mass lesion or fluid collection. No abnormal mass effect or midline shift. No evidence of acute intracranial hemorrhage or infarct.  Ventricular size is normal. Cerebellum unremarkable. Vascular: No asymmetric hyperdense vasculature at the skull base. Skull: Intact Other: Mastoid air cells and middle ear cavities are clear. CT MAXILLOFACIAL FINDINGS Osseous: Mild motion artifact noted. No acute facial fracture. No mandibular  dislocation. Orbits: Negative. No traumatic or inflammatory finding. Sinuses: Clear. Soft tissues: Negative. CT CERVICAL SPINE FINDINGS Alignment: Normal.  No listhesis. Skull base and vertebrae: Craniocervical alignment is normal. The atlantodental interval is not widened. There is no acute fracture of the cervical spine. 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 No canal hematoma. The spinal canal is otherwise widely patent. No prevertebral soft tissue swelling. No pathologic adenopathy within the neck. No paraspinal fluid collections identified. Disc levels: There is mild intervertebral disc calcifications and posterior disc bulges noted at C3-C7 in keeping with changes of mild degenerative disc disease. Intervertebral disc height is preserved. Vertebral body height is preserved. The prevertebral soft tissues are not thickened on sagittal reformats. 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 Upper chest: Unremarkable Other: None IMPRESSION: No acute intracranial injury.  No calvarial fracture. No acute facial fracture. No acute fracture or listhesis the cervical spine. Electronically Signed   By: Fidela Salisbury MD   On: 05/31/2021 21:03   CT Angio Chest PE W and/or Wo Contrast  Result Date: 05/31/2021 CLINICAL DATA:  Abdominal pain, elevated LFTs and encephalopathy EXAM: CT ANGIOGRAPHY CHEST CT ABDOMEN AND PELVIS WITH CONTRAST TECHNIQUE: Multidetector CT imaging of the chest was performed using the standard protocol during bolus administration of intravenous contrast. Multiplanar CT image reconstructions and MIPs were obtained to evaluate the vascular anatomy. Multidetector CT imaging of the abdomen and pelvis was performed using the standard protocol during bolus administration  of intravenous contrast. CONTRAST:  178m OMNIPAQUE IOHEXOL 350 MG/ML SOLN COMPARISON:  None. FINDINGS: CTA CHEST FINDINGS Cardiovascular: Contrast injection is sufficient to demonstrate satisfactory opacification of the pulmonary arteries to the segmental level.There are small segmental pulmonary emboli within the right upper, middle and lower lobes. On the left, there is a segmental embolus in the left upper lobe. The main pulmonary artery is enlarged, measuring 4.1 cm. There is no CT evidence of acute right heart strain. There is moderate aortic atherosclerosis. There is a normal 3-vessel arch branching pattern. Heart size is normal. No pericardial effusion. There are coronary artery calcifications. Mediastinum/Nodes: No mediastinal, hilar or axillary lymphadenopathy. The visualized thyroid and thoracic esophageal course are unremarkable. Lungs/Pleura: No pulmonary nodules or masses. No pleural effusion or pneumothorax. No focal airspace consolidation. No focal pleural abnormality. Musculoskeletal: No chest wall abnormality. No acute or significant osseous findings. Review of the MIP images confirms the above findings. CT ABDOMEN and PELVIS FINDINGS Hepatobiliary: Innumerable lesions throughout the liver. The largest lesion is in the right hepatic lobe and measures approximately 5.7 cm. Mild gallbladder wall thickening Pancreas: Pancreatic duct is dilated in the distal body and tail. The tail is atrophic. Spleen: Normal. Adrenals/Urinary Tract: --Adrenal glands: Normal. --Right kidney/ureter: No hydronephrosis or perinephric stranding. No nephrolithiasis. No obstructing ureteral stones. --Left kidney/ureter: No hydronephrosis or perinephric stranding. No nephrolithiasis. No obstructing ureteral stones. --Urinary bladder: Unremarkable. Stomach/Bowel: --Stomach/Duodenum: No hiatal hernia or other gastric abnormality. Normal duodenal course and caliber. --Small bowel: No dilatation or inflammation. --Colon: No focal  abnormality. --Appendix: Not visualized. No right lower quadrant inflammation or  free fluid. Vascular/Lymphatic: Aortic atherosclerosis. Aorta bi-iliac stent is patent. 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 No abdominal or pelvic lymphadenopathy. Reproductive: Status post hysterectomy. No adnexal mass. Small volume free fluid in the pelvis. Musculoskeletal. No bony spinal canal stenosis or focal osseous abnormality. Other: None. IMPRESSION: 1. Small segmental pulmonary emboli within the right upper, middle and lower lobes and left upper lobe. No CT evidence of acute right heart strain. 2. Innumerable liver lesions, consistent with metastatic disease. 3. Dilated main pulmonary artery, consistent with pulmonary hypertension. 4. 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 Critical Value/emergent results were called by telephone at the time of interpretation on 05/31/2021 at 9:10 pm to provider DAVID YAO , who verbally acknowledged these results. Aortic Atherosclerosis (ICD10-I70.0). Electronically Signed   By: Ulyses Jarred M.D.   On: 05/31/2021 21:11   CT Cervical Spine Wo Contrast  Result Date: 05/31/2021 CLINICAL DATA:  Mental status change, unknown cause; Neck trauma (Age >= 65y); Facial trauma. Increased confusion, slurred speech and multiple falls over the past week. EXAM: CT HEAD WITHOUT CONTRAST CT MAXILLOFACIAL WITHOUT CONTRAST CT CERVICAL SPINE WITHOUT CONTRAST TECHNIQUE: Multidetector CT imaging of the head, cervical spine, and maxillofacial structures were performed using the standard protocol without intravenous contrast. Multiplanar CT image reconstructions of the cervical spine and maxillofacial structures were also generated. COMPARISON:  None. FINDINGS: CT HEAD FINDINGS Brain: Normal anatomic configuration. Parenchymal volume  loss is commensurate with the patient's age. Moderate periventricular white matter changes are present likely reflecting the sequela of small vessel ischemia. Remote lacunar infarct versus dilated perivascular space within the right basal ganglia. No abnormal intra or extra-axial mass lesion or fluid collection. No abnormal mass effect or midline shift. No evidence of acute intracranial hemorrhage or infarct. Ventricular size is normal. Cerebellum unremarkable. Vascular: No asymmetric hyperdense vasculature at the skull base. Skull: Intact Other: Mastoid air cells and middle ear cavities are clear. CT MAXILLOFACIAL FINDINGS Osseous: Mild motion artifact noted. No acute facial fracture. No mandibular dislocation. Orbits: Negative. No traumatic or inflammatory finding. Sinuses: Clear. Soft tissues: Negative. CT CERVICAL SPINE FINDINGS Alignment: Normal.  No listhesis. Skull base and vertebrae: Craniocervical alignment is normal. The atlantodental interval is not widened. There is no acute fracture of the cervical spine. 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 No canal hematoma. The spinal canal is otherwise widely patent. No prevertebral soft tissue swelling. No pathologic adenopathy within the neck. No paraspinal fluid collections identified. Disc levels: There is mild intervertebral disc calcifications and posterior disc bulges noted at C3-C7 in keeping with changes of mild degenerative disc disease. Intervertebral disc height is preserved. Vertebral body height is preserved. The prevertebral soft tissues are not thickened on sagittal reformats. 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 Upper chest: Unremarkable Other: None IMPRESSION: No acute intracranial injury.  No calvarial  fracture. No acute facial fracture. No acute fracture or listhesis the cervical spine. Electronically Signed   By: Fidela Salisbury MD   On: 05/31/2021 21:03   CT ABDOMEN PELVIS W CONTRAST  Result Date: 05/31/2021 CLINICAL DATA:  Abdominal pain, elevated LFTs and encephalopathy EXAM: CT ANGIOGRAPHY CHEST CT ABDOMEN AND PELVIS WITH CONTRAST  TECHNIQUE: Multidetector CT imaging of the chest was performed using the standard protocol during bolus administration of intravenous contrast. Multiplanar CT image reconstructions and MIPs were obtained to evaluate the vascular anatomy. Multidetector CT imaging of the abdomen and pelvis was performed using the standard protocol during bolus administration of intravenous contrast. CONTRAST:  139m OMNIPAQUE IOHEXOL 350 MG/ML SOLN COMPARISON:  None. FINDINGS: CTA CHEST FINDINGS Cardiovascular: Contrast injection is sufficient to demonstrate satisfactory opacification of the pulmonary arteries to the segmental level.There are small segmental pulmonary emboli within the right upper, middle and lower lobes. On the left, there is a segmental embolus in the left upper lobe. The main pulmonary artery is enlarged, measuring 4.1 cm. There is no CT evidence of acute right heart strain. There is moderate aortic atherosclerosis. There is a normal 3-vessel arch branching pattern. Heart size is normal. No pericardial effusion. There are coronary artery calcifications. Mediastinum/Nodes: No mediastinal, hilar or axillary lymphadenopathy. The visualized thyroid and thoracic esophageal course are unremarkable. Lungs/Pleura: No pulmonary nodules or masses. No pleural effusion or pneumothorax. No focal airspace consolidation. No focal pleural abnormality. Musculoskeletal: No chest wall abnormality. No acute or significant osseous findings. Review of the MIP images confirms the above findings. CT ABDOMEN and PELVIS FINDINGS Hepatobiliary: Innumerable lesions throughout the liver. The largest lesion  is in the right hepatic lobe and measures approximately 5.7 cm. Mild gallbladder wall thickening Pancreas: Pancreatic duct is dilated in the distal body and tail. The tail is atrophic. Spleen: Normal. Adrenals/Urinary Tract: --Adrenal glands: Normal. --Right kidney/ureter: No hydronephrosis or perinephric stranding. No nephrolithiasis. No obstructing ureteral stones. --Left kidney/ureter: No hydronephrosis or perinephric stranding. No nephrolithiasis. No obstructing ureteral stones. --Urinary bladder: Unremarkable. Stomach/Bowel: --Stomach/Duodenum: No hiatal hernia or other gastric abnormality. Normal duodenal course and caliber. --Small bowel: No dilatation or inflammation. --Colon: No focal abnormality. --Appendix: Not visualized. No right lower quadrant inflammation or free fluid. Vascular/Lymphatic: Aortic atherosclerosis. Aorta bi-iliac stent is patent. 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 No abdominal or pelvic lymphadenopathy. Reproductive: Status post hysterectomy. No adnexal mass. Small volume free fluid in the pelvis. Musculoskeletal. No bony spinal canal stenosis or focal osseous abnormality. Other: None. IMPRESSION: 1. Small segmental pulmonary emboli within the right upper, middle and lower lobes and left upper lobe. No CT evidence of acute right heart strain. 2. Innumerable liver lesions, consistent with metastatic disease. 3. Dilated main pulmonary artery, consistent with pulmonary hypertension. 4. 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 Critical Value/emergent results were called by telephone at the time of interpretation on 05/31/2021 at 9:10 pm to provider DAVID YAO , who verbally acknowledged these results. Aortic Atherosclerosis (ICD10-I70.0). Electronically Signed   By: KUlyses JarredM.D.   On: 05/31/2021 21:11   CT  Maxillofacial Wo Contrast  Result Date: 05/31/2021 CLINICAL DATA:  Mental status change, unknown cause; Neck trauma (Age >= 65y); Facial trauma. Increased confusion, slurred speech and multiple falls over the past week. EXAM: CT HEAD WITHOUT CONTRAST CT MAXILLOFACIAL WITHOUT CONTRAST CT CERVICAL SPINE WITHOUT CONTRAST TECHNIQUE: Multidetector CT imaging of the head, cervical spine, and maxillofacial structures were performed using the standard protocol without intravenous contrast. Multiplanar CT image reconstructions of the cervical spine and maxillofacial structures were also generated. COMPARISON:  None. FINDINGS: CT HEAD FINDINGS Brain: Normal anatomic configuration. Parenchymal volume loss is commensurate with the patient's age. Moderate periventricular white  matter changes are present likely reflecting the sequela of small vessel ischemia. Remote lacunar infarct versus dilated perivascular space within the right basal ganglia. No abnormal intra or extra-axial mass lesion or fluid collection. No abnormal mass effect or midline shift. No evidence of acute intracranial hemorrhage or infarct. Ventricular size is normal. Cerebellum unremarkable. Vascular: No asymmetric hyperdense vasculature at the skull base. Skull: Intact Other: Mastoid air cells and middle ear cavities are clear. CT MAXILLOFACIAL FINDINGS Osseous: Mild motion artifact noted. No acute facial fracture. No mandibular dislocation. Orbits: Negative. No traumatic or inflammatory finding. Sinuses: Clear. Soft tissues: Negative. CT CERVICAL SPINE FINDINGS Alignment: Normal.  No listhesis. Skull base and vertebrae: Craniocervical alignment is normal. The atlantodental interval is not widened. There is no acute fracture of the cervical spine. 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 No canal hematoma. The spinal canal is otherwise  widely patent. No prevertebral soft tissue swelling. No pathologic adenopathy within the neck. No paraspinal fluid collections identified. Disc levels: There is mild intervertebral disc calcifications and posterior disc bulges noted at C3-C7 in keeping with changes of mild degenerative disc disease. Intervertebral disc height is preserved. Vertebral body height is preserved. The prevertebral soft tissues are not thickened on sagittal reformats. 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 Upper chest: Unremarkable Other: None IMPRESSION: No acute intracranial injury.  No calvarial fracture. No acute facial fracture. No acute fracture or listhesis the cervical spine. Electronically Signed   By: Fidela Salisbury MD   On: 05/31/2021 21:03     IMPRESSION:      Dysphagia.  New onset.       Acute PE, started on heparin.  Patient has no so subjective respiratory symptoms and no hypoxia  FOBT positive stool.     Normocytic anemia which looks like iron deficiency.  History of iron deficiency in the past.     Multiple metastatic liver lesions.  Have not been worked up.  IR consulted for ultrasound-guided liver biopsy.    Fever.  WBCs 14.4.     Thrombocytopenia.  No splenomegaly on imaging.      PLAN:     Hopefully IR will find a target lesion for biopsy.     Plan EGD 11 AM tmrw with possible esophageal dilatation tomorrow.  Would need to be off heparin for a few hours if dilation to be performed.  Alternatively we could perform just a diagnostic EGD.  Ordered multiple tumor markers to be obtained today.  Ok for full liquid diet, npo after mn   Azucena Freed  06/01/2021, 9:53 AM Phone [phone]

## 2021-06-01 NOTE — TOC Benefit Eligibility Note (Signed)
Patient Teacher, English as a foreign language completed.    The patient is currently admitted and upon discharge could be taking Eliquis 5 mg.  The current 30 day co-pay is, $0.00.   The patient is insured through [place] [place] Patient Advocate Specialist [place] [employer] Direct Number: [id number]  Fax: [phone]

## 2021-06-01 NOTE — ED Notes (Signed)
Cleaned for dark soft stool, small amount, skin/peri care done and pruwick catheter changed.

## 2021-06-01 NOTE — Progress Notes (Signed)
ANTICOAGULATION CONSULT NOTE  Pharmacy Consult for Heparin Indication: pulmonary embolus  Allergies  Allergen Reactions   Donepezil Itching    SEVERE ITCHING   Penicillins Cross Reactors     Unknown    Sulfa Antibiotics     Patient Measurements: Height: '5\' 7"'$  (170.2 cm) Weight: 59 kg (130 lb) IBW/kg (Calculated) : 61.6 Heparin Dosing Weight: 59 kg  Vital Signs: Temp: 99.2 F (37.3 C) (08/04 1448) Temp Source: Oral (08/04 1448) BP: 138/75 (08/04 1448) Pulse Rate: 88 (08/04 1448)  Labs: Recent Labs    05/31/21 1711 05/31/21 1803 06/01/21 0556 06/01/21 0653 06/01/21 1605  HGB 10.4*  --  9.5*  --   --   HCT 31.2*  --  30.4*  --   --   PLT 103*  --  112*  --   --   APTT 38*  --   --   --   --   LABPROT 18.0*  --   --   --   --   INR 1.5*  --   --   --   --   HEPARINUNFRC  --   --   --  0.15* 0.26*  CREATININE 0.71  --  0.53  --   --   CKTOTAL  --  81  --   --   --      Estimated Creatinine Clearance: 54.9 mL/min (by C-G formula based on SCr of 0.53 mg/dL).   Medical History: Past Medical History:  Diagnosis Date   A-fib (HCC)    COPD (chronic obstructive pulmonary disease) (HCC)    High cholesterol    Hypertension     Medications:  Scheduled:   diltiazem  180 mg Oral Daily   fluticasone furoate-vilanterol  1 puff Inhalation Daily   metoprolol succinate  25 mg Oral Daily   rivastigmine  4.5 mg Oral BID   umeclidinium bromide  1 puff Inhalation Daily    Assessment: 75 YO female who presented with confusion, lower abdominal pain, multiple falls in the last week, slurred speech, and having issues swallowing so not eating or drinking. On chest CT, found small segmental pulmonary emboli within the right upper, middle and lower lobes and upper left lobe.  Heparin level this evening remains SUBtherapeutic though trending up after a rate increase earlier today (HL 0.26 << 0.15, goal of 0.3-0.7). RN did turn the drip off for an hour after the lab was drawn due  to confusion about the holding time for surgery. No bleeding noted.   Noted plans for Heparin to turn off at 0130 tomorrow for planned esophageal dilation, also planning IR biopsy of liver lesion.  Goal of Therapy:  Heparin level 0.3-0.7 units/ml Monitor platelets by anticoagulation protocol: Yes   Plan:  - Increase Heparin to 1300 units/hr (13 ml/hr) - Stop date/time for 8/5 at 0130 - discussed with RN - No further labs, will follow-up on restart plans post-procedure,   Thank you for allowing pharmacy to be a part of this patient's care.  Alycia Rossetti, PharmD, BCPS Clinical Pharmacist Clinical phone for 06/01/2021: [phone] 06/01/2021 5:48 PM   **Pharmacist phone directory can now be found on [email or web address] ([email or web address]).  Listed under [place].

## 2021-06-01 NOTE — Anesthesia Preprocedure Evaluation (Addendum)
Anesthesia Evaluation  Patient identified by MRN, date of birth, ID band Patient awake    Reviewed: Allergy & Precautions, NPO status , Patient's Chart, lab work & pertinent test results  Airway Mallampati: I  TM Distance: >3 FB Neck ROM: Full    Dental no notable dental hx. (+) Edentulous Upper, Edentulous Lower   Pulmonary COPD, Current Smoker, PE   Pulmonary exam normal breath sounds clear to auscultation       Cardiovascular hypertension, Normal cardiovascular exam+ dysrhythmias Atrial Fibrillation  Rhythm:Irregular Rate:Normal     Neuro/Psych Dementia    GI/Hepatic negative GI ROS, Neg liver ROS, CA w liver mets   Endo/Other    Renal/GU      Musculoskeletal negative musculoskeletal ROS (+)   Abdominal   Peds  Hematology  (+) anemia ,   Anesthesia Other Findings All see list  Reproductive/Obstetrics                            Anesthesia Physical Anesthesia Plan  ASA: 3  Anesthesia Plan:    Post-op Pain Management:    Induction: Intravenous  PONV Risk Score and Plan: Treatment may vary due to age or medical condition  Airway Management Planned: Natural Airway and Nasal Cannula  Additional Equipment: None  Intra-op Plan:   Post-operative Plan: Extubation in OR  Informed Consent: I have reviewed the patients History and Physical, chart, labs and discussed the procedure including the risks, benefits and alternatives for the proposed anesthesia with the patient or authorized representative who has indicated his/her understanding and acceptance.     Dental advisory given  Plan Discussed with:   Anesthesia Plan Comments: (Dysphagia for EGD)       Anesthesia Quick Evaluation

## 2021-06-01 NOTE — ED Notes (Signed)
Placed patient back on the external cath patient is resting with family at bedside

## 2021-06-01 NOTE — Progress Notes (Signed)
PROGRESS NOTE  Anne West  QXI:[id number] DOB: 1946/02/27 DOA: 05/31/2021 PCP: Default, Provider, MD   Brief Narrative: Anne West is a 75 y.o. female with a history of AFib not on anticoagulation, COPD, HTN, HLD, cognitive impairment, AAA s/p repair, and recent hospitalization for abdominal pain thought to be due to constipation where she was found to have numerous liver lesions and thickened gallbladder without other evidence of cholecystitis. She returned home and has continued to eat poorly, grow more confused than baseline and have several falls. Due to this and some slurred speech, her daughter brought her to the ED where she was febrile at 100.42F, tachycardic (sinus), without hypotension or hypoxia. WBC 14.4k, hgb 10.4g/dl, platelets 103k. SCr 0.7. AST 133, ALT 145, alk phos 814, TBili 2.5. Covid, flue PCR negative. Vast imaging evaluation revealed hepatic lesions, stable gallbladder wall thickening without cholelithiasis or pericholecystic fluid to suggest cholecystitis, and small bilateral pulmonary emboli.   Blood cultures were drawn, broad IV antibiotics started for SIRS (NOS), IV heparin was started for PE without right heart strain. GI is consulted for dysphagia. Oncology and IR consulted for recommendations and consideration of biopsy.     Assessment & Plan: Principal Problem:   Acute pulmonary embolism (HCC) Active Problems:   Sepsis ([place])   Acute metabolic encephalopathy   Elevated liver enzymes   Anemia  Falls, increasing confusion, slurred speech: CT head, maxillofacial, and cervical spine without acute abnormality/hemorrhage, dislocation or fracture. - MR brain - Metabolic work up pending.  - Delirium precautions.   - PT/OT  SIRS: Unclear source at this time, Tmax is 100.72F . No focal RUQ tenderness, though GB wall is thickened without clear etiology. It is possible that the findings supporting this diagnosis are attributable to VTE, metastatic cancer,  and dehydration. PCT notably elevated. - Continue empiric abx, especially as we consider biopsy and await culture results.  - Trend leukocytosis. - ?If HIDA would be helpful.  Acute pulmonary emboli: Provoked by malignancy. Not hemodynamically compromising or causing hypoxia.  - Continue IV heparin which can be paused if needed for biopsy. - Check echocardiogram  Metastatic cancer, unknown primary: Numerous hepatic lesions on CT.  - IR consultation appreciated - Oncology, Dr. Chryl Heck, consulted for further recommendations.  - MR brain pending as above  Unintentional weight loss, poor per oral intake: Nonspecific findings, but with report of dysphagia, GI is also consulted.   HLD: LFT elevation noted as above, likely due to metastases. CK wnl.  - Can hold statin for now.   COPD:  - Prn BDs. Continue trelegy formulary equivalent.  HTN:  - Hold norvasc 72m, but continue AV nodal agents.  PAF: Has remained in sinus rhythm (ST > NSR).  - Initiating AC as above.  - Continue home diltiazem, metoprolol 274mqd.  Dementia:  - Continue exelon  Thrombocytopenia: Stable.  - Continue monitoring level and for bleeding.   Normocytic anemia: Possibly due to metastatic disease. B12 sufficient, ferritin grossly elevated with low TIBC - Monitor telemetry  DVT prophylaxis: IV heparin Code Status: Full Family Communication: Daughter at bedside Disposition Plan:  Status is: Inpatient  Remains inpatient appropriate because:Altered mental status, Ongoing diagnostic testing needed not appropriate for outpatient work up, and Unsafe d/c plan  Dispo: The patient is from: Home              Anticipated d/c is to:  TBD              Patient currently is not  medically stable to d/c.   Difficult to place patient No  Consultants:  GI Oncology IR  Procedures:  None  Antimicrobials: Vancomycin, aztreonam, flagyl   Subjective: Pt oriented to self, having trouble speaking clearly, no complaints  of pain at all. No dyspnea. No leg swelling or chest pain.   Objective: Vitals:   06/01/21 0258 06/01/21 0600 06/01/21 0700 06/01/21 0930  BP: 138/66 (!) 128/51 (!) 140/52 (!) 124/49  Pulse: (!) 103 94 92 89  Resp: 15 14  16   Temp:    98.7 F (37.1 C)  TempSrc:    Oral  SpO2: 95% 93% 90% 95%  Weight:      Height:        Intake/Output Summary (Last 24 hours) at 06/01/2021 0942 Last data filed at 06/01/2021 2774 Gross per 24 hour  Intake 287.35 ml  Output --  Net 287.35 ml   Filed Weights   05/31/21 2100  Weight: 59 kg    Gen: Frail, elderly female in no distress Pulm: Non-labored breathing room air. Clear to auscultation bilaterally.  CV: Regular tachycardia. No murmur, rub, or gallop. No JVD, no pedal edema. GI: Abdomen soft, no significant RUQ tenderness, non-distended, with normoactive bowel sounds. No organomegaly or masses felt. Ext: Warm, no deformities Skin: Left superior face with scabbed/healing rash consistent with carpet burn. No other rashes, lesions or ulcers. No bleeding noted. Neuro: Alert and disoriented. Some dysarthria, though this improved with continued efforts. MAE. No focal neurological deficits. Psych: Judgement and insight appear normal. Mood & affect appropriate.   Data Reviewed: I have personally reviewed following labs and imaging studies  CBC: Recent Labs  Lab 05/31/21 1711 06/01/21 0556  WBC 14.4* 13.6*  NEUTROABS 10.3*  --   HGB 10.4* 9.5*  HCT 31.2* 30.4*  MCV 86.0 91.3  PLT 103* 128*   Basic Metabolic Panel: Recent Labs  Lab 05/31/21 1711 06/01/21 0556  NA 134* 134*  K 3.7 3.3*  CL 92* 98  CO2 28 23  GLUCOSE 102* 111*  BUN 21 17  CREATININE 0.71 0.53  CALCIUM 8.8* 8.2*   GFR: Estimated Creatinine Clearance: 54.9 mL/min (by C-G formula based on SCr of 0.53 mg/dL). Liver Function Tests: Recent Labs  Lab 05/31/21 1711 06/01/21 0556  AST 133* 110*  ALT 45* 41  ALKPHOS 814* 565*  BILITOT 2.5* 2.6*  PROT 6.9 6.0*   ALBUMIN 2.6* 2.3*   No results for input(s): LIPASE, AMYLASE in the last 168 hours. No results for input(s): AMMONIA in the last 168 hours. Coagulation Profile: Recent Labs  Lab 05/31/21 1711  INR 1.5*   Cardiac Enzymes: Recent Labs  Lab 05/31/21 1803  CKTOTAL 81   BNP (last 3 results) No results for input(s): PROBNP in the last 8760 hours. HbA1C: No results for input(s): HGBA1C in the last 72 hours. CBG: No results for input(s): GLUCAP in the last 168 hours. Lipid Profile: No results for input(s): CHOL, HDL, LDLCALC, TRIG, CHOLHDL, LDLDIRECT in the last 72 hours. Thyroid Function Tests: Recent Labs    06/01/21 0653  TSH 2.222   Anemia Panel: Recent Labs    06/01/21 0556 06/01/21 0653  VITAMINB12  --  3,278*  FERRITIN  --  1,079*  TIBC  --  155*  IRON  --  19*  RETICCTPCT 3.6*  --    Urine analysis:    Component Value Date/Time   COLORURINE YELLOW 06/01/2021 0224   APPEARANCEUR HAZY (A) 06/01/2021 0224   LABSPEC 1.043 (H) 06/01/2021  [place] 5.0 06/01/2021 0224   GLUCOSEU NEGATIVE 06/01/2021 0224   HGBUR MODERATE (A) 06/01/2021 0224   BILIRUBINUR NEGATIVE 06/01/2021 0224   KETONESUR 5 (A) 06/01/2021 0224   PROTEINUR 30 (A) 06/01/2021 0224   UROBILINOGEN 1.0 09/17/2011 1948   NITRITE NEGATIVE 06/01/2021 0224   LEUKOCYTESUR NEGATIVE 06/01/2021 0224   Recent Results (from the past 240 hour(s))  Blood Culture (routine x 2)     Status: None (Preliminary result)   Collection Time: 05/31/21  5:08 PM   Specimen: BLOOD  Result Value Ref Range Status   Specimen Description BLOOD LEFT ANTECUBITAL  Final   Special Requests   Final    BOTTLES DRAWN AEROBIC AND ANAEROBIC Blood Culture adequate volume   Culture   Final    NO GROWTH < 12 HOURS Performed at [facility], [place] [address], [place], [place] [address]    Report Status PENDING  Incomplete  Blood Culture (routine x 2)     Status: None (Preliminary result)   Collection Time: 05/31/21  5:11  PM   Specimen: BLOOD  Result Value Ref Range Status   Specimen Description BLOOD LEFT ANTECUBITAL  Final   Special Requests   Final    AEROBIC BOTTLE ONLY Blood Culture results may not be optimal due to an inadequate volume of blood received in culture bottles   Culture   Final    NO GROWTH < 24 HOURS Performed at [facility], [place] [address] [place], [place] [address]    Report Status PENDING  Incomplete  Resp Panel by RT-PCR (Flu A&B, Covid) Nasopharyngeal Swab     Status: None   Collection Time: 05/31/21  7:12 PM   Specimen: Nasopharyngeal Swab; Nasopharyngeal(NP) swabs in vial transport medium  Result Value Ref Range Status   SARS Coronavirus 2 by RT PCR NEGATIVE NEGATIVE Final    Comment: (NOTE) SARS-CoV-2 target nucleic acids are NOT DETECTED.  The SARS-CoV-2 RNA is generally detectable in upper respiratory specimens during the acute phase of infection. The lowest concentration of SARS-CoV-2 viral copies this assay can detect is 138 copies/mL. A negative result does not preclude SARS-Cov-2 infection and should not be used as the sole basis for treatment or other patient management decisions. A negative result may occur with  improper specimen collection/handling, submission of specimen other than nasopharyngeal swab, presence of viral mutation(s) within the areas targeted by this assay, and inadequate number of viral copies(<138 copies/mL). A negative result must be combined with clinical observations, patient history, and epidemiological information. The expected result is Negative.  Fact Sheet for Patients:  EntrepreneurPulse.com.au  Fact Sheet for Healthcare Providers:  IncredibleEmployment.be  This test is no t yet approved or cleared by the [place] FDA and  has been authorized for detection and/or diagnosis of SARS-CoV-2 by FDA under an Emergency Use Authorization (EUA). This EUA will remain  in effect (meaning this  test can be used) for the duration of the COVID-19 declaration under Section 564(b)(1) of the Act, 21 U.S.C.section 360bbb-3(b)(1), unless the authorization is terminated  or revoked sooner.       Influenza A by PCR NEGATIVE NEGATIVE Final   Influenza B by PCR NEGATIVE NEGATIVE Final    Comment: (NOTE) The Xpert Xpress SARS-CoV-2/FLU/RSV plus assay is intended as an aid in the diagnosis of influenza from Nasopharyngeal swab specimens and should not be used as a sole basis for treatment. Nasal washings and aspirates are unacceptable for Xpert Xpress SARS-CoV-2/FLU/RSV testing.  Fact Sheet for Patients: EntrepreneurPulse.com.au  Fact Sheet for Healthcare Providers: IncredibleEmployment.be  This test is not yet approved or cleared by the [place] FDA and has been authorized for detection and/or diagnosis of SARS-CoV-2 by FDA under an Emergency Use Authorization (EUA). This EUA will remain in effect (meaning this test can be used) for the duration of the COVID-19 declaration under Section 564(b)(1) of the Act, 21 U.S.C. section 360bbb-3(b)(1), unless the authorization is terminated or revoked.  Performed at [facility], [place] [address] [place], [place] [address]       Radiology Studies: DG Chest 1 View  Result Date: 05/31/2021 CLINICAL DATA:  Multiple falls, slurred speech, abdominal pain EXAM: CHEST  1 VIEW COMPARISON:  05/16/2021 FINDINGS: Single frontal view of the chest demonstrates an unremarkable cardiac silhouette. No acute airspace disease, effusion, or pneumothorax. Chronic interstitial scarring is noted. No acute bony abnormalities. Partial visualization of aortic stent. IMPRESSION: 1. No acute intrathoracic process. Electronically Signed   By: Randa Ngo M.D.   On: 05/31/2021 18:28   CT HEAD WO CONTRAST (5MM)  Result Date: 05/31/2021 CLINICAL DATA:  Mental status change, unknown cause; Neck trauma (Age >= 65y); Facial  trauma. Increased confusion, slurred speech and multiple falls over the past week. EXAM: CT HEAD WITHOUT CONTRAST CT MAXILLOFACIAL WITHOUT CONTRAST CT CERVICAL SPINE WITHOUT CONTRAST TECHNIQUE: Multidetector CT imaging of the head, cervical spine, and maxillofacial structures were performed using the standard protocol without intravenous contrast. Multiplanar CT image reconstructions of the cervical spine and maxillofacial structures were also generated. COMPARISON:  None. FINDINGS: CT HEAD FINDINGS Brain: Normal anatomic configuration. Parenchymal volume loss is commensurate with the patient's age. Moderate periventricular white matter changes are present likely reflecting the sequela of small vessel ischemia. Remote lacunar infarct versus dilated perivascular space within the right basal ganglia. No abnormal intra or extra-axial mass lesion or fluid collection. No abnormal mass effect or midline shift. No evidence of acute intracranial hemorrhage or infarct. Ventricular size is normal. Cerebellum unremarkable. Vascular: No asymmetric hyperdense vasculature at the skull base. Skull: Intact Other: Mastoid air cells and middle ear cavities are clear. CT MAXILLOFACIAL FINDINGS Osseous: Mild motion artifact noted. No acute facial fracture. No mandibular dislocation. Orbits: Negative. No traumatic or inflammatory finding. Sinuses: Clear. Soft tissues: Negative. CT CERVICAL SPINE FINDINGS Alignment: Normal.  No listhesis. Skull base and vertebrae: Craniocervical alignment is normal. The atlantodental interval is not widened. There is no acute fracture of the cervical spine. 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 No canal hematoma. The spinal canal is otherwise widely patent. No prevertebral soft tissue swelling. No pathologic adenopathy within the neck. No paraspinal fluid collections  identified. Disc levels: There is mild intervertebral disc calcifications and posterior disc bulges noted at C3-C7 in keeping with changes of mild degenerative disc disease. Intervertebral disc height is preserved. Vertebral body height is preserved. The prevertebral soft tissues are not thickened on sagittal reformats. 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 Upper chest: Unremarkable Other: None IMPRESSION: No acute intracranial injury.  No calvarial fracture. No acute facial fracture. No acute fracture or listhesis the cervical spine. Electronically Signed   By: Fidela Salisbury MD   On: 05/31/2021 21:03   CT Angio Chest PE W and/or Wo Contrast  Result Date: 05/31/2021 CLINICAL DATA:  Abdominal  pain, elevated LFTs and encephalopathy EXAM: CT ANGIOGRAPHY CHEST CT ABDOMEN AND PELVIS WITH CONTRAST TECHNIQUE: Multidetector CT imaging of the chest was performed using the standard protocol during bolus administration of intravenous contrast. Multiplanar CT image reconstructions and MIPs were obtained to evaluate the vascular anatomy. Multidetector CT imaging of the abdomen and pelvis was performed using the standard protocol during bolus administration of intravenous contrast. CONTRAST:  176m OMNIPAQUE IOHEXOL 350 MG/ML SOLN COMPARISON:  None. FINDINGS: CTA CHEST FINDINGS Cardiovascular: Contrast injection is sufficient to demonstrate satisfactory opacification of the pulmonary arteries to the segmental level.There are small segmental pulmonary emboli within the right upper, middle and lower lobes. On the left, there is a segmental embolus in the left upper lobe. The main pulmonary artery is enlarged, measuring 4.1 cm. There is no CT evidence of acute right heart strain. There is moderate aortic atherosclerosis. There is a normal 3-vessel arch branching pattern. Heart size is normal. No pericardial  effusion. There are coronary artery calcifications. Mediastinum/Nodes: No mediastinal, hilar or axillary lymphadenopathy. The visualized thyroid and thoracic esophageal course are unremarkable. Lungs/Pleura: No pulmonary nodules or masses. No pleural effusion or pneumothorax. No focal airspace consolidation. No focal pleural abnormality. Musculoskeletal: No chest wall abnormality. No acute or significant osseous findings. Review of the MIP images confirms the above findings. CT ABDOMEN and PELVIS FINDINGS Hepatobiliary: Innumerable lesions throughout the liver. The largest lesion is in the right hepatic lobe and measures approximately 5.7 cm. Mild gallbladder wall thickening Pancreas: Pancreatic duct is dilated in the distal body and tail. The tail is atrophic. Spleen: Normal. Adrenals/Urinary Tract: --Adrenal glands: Normal. --Right kidney/ureter: No hydronephrosis or perinephric stranding. No nephrolithiasis. No obstructing ureteral stones. --Left kidney/ureter: No hydronephrosis or perinephric stranding. No nephrolithiasis. No obstructing ureteral stones. --Urinary bladder: Unremarkable. Stomach/Bowel: --Stomach/Duodenum: No hiatal hernia or other gastric abnormality. Normal duodenal course and caliber. --Small bowel: No dilatation or inflammation. --Colon: No focal abnormality. --Appendix: Not visualized. No right lower quadrant inflammation or free fluid. Vascular/Lymphatic: Aortic atherosclerosis. Aorta bi-iliac stent is patent. 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 No abdominal or pelvic lymphadenopathy. Reproductive: Status post hysterectomy. No adnexal mass. Small volume free fluid in the pelvis. Musculoskeletal. No bony spinal canal stenosis or focal osseous abnormality. Other: None. IMPRESSION: 1. Small segmental pulmonary emboli within the right upper, middle and lower lobes and  left upper lobe. No CT evidence of acute right heart strain. 2. Innumerable liver lesions, consistent with metastatic disease. 3. Dilated main pulmonary artery, consistent with pulmonary hypertension. 4. 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 Critical Value/emergent results were called by telephone at the time of interpretation on 05/31/2021 at 9:10 pm to provider DAVID YAO , who verbally acknowledged these results. Aortic Atherosclerosis (ICD10-I70.0). Electronically Signed   By: KUlyses JarredM.D.   On: 05/31/2021 21:11   CT Cervical Spine Wo Contrast  Result Date: 05/31/2021 CLINICAL DATA:  Mental status change, unknown cause; Neck trauma (Age >= 65y); Facial trauma. Increased confusion, slurred speech and multiple falls over the past week. EXAM: CT HEAD WITHOUT CONTRAST CT MAXILLOFACIAL WITHOUT CONTRAST CT CERVICAL SPINE WITHOUT CONTRAST TECHNIQUE: Multidetector CT imaging of the head, cervical spine, and maxillofacial structures were performed using the standard protocol without intravenous contrast. Multiplanar CT image reconstructions of the cervical spine and maxillofacial structures were also generated. COMPARISON:  None. FINDINGS: CT HEAD FINDINGS  Brain: Normal anatomic configuration. Parenchymal volume loss is commensurate with the patient's age. Moderate periventricular white matter changes are present likely reflecting the sequela of small vessel ischemia. Remote lacunar infarct versus dilated perivascular space within the right basal ganglia. No abnormal intra or extra-axial mass lesion or fluid collection. No abnormal mass effect or midline shift. No evidence of acute intracranial hemorrhage or infarct. Ventricular size is normal. Cerebellum unremarkable. Vascular: No asymmetric hyperdense vasculature at the skull base. Skull: Intact Other: Mastoid air cells and middle ear cavities are clear. CT MAXILLOFACIAL FINDINGS Osseous: Mild motion artifact  noted. No acute facial fracture. No mandibular dislocation. Orbits: Negative. No traumatic or inflammatory finding. Sinuses: Clear. Soft tissues: Negative. CT CERVICAL SPINE FINDINGS Alignment: Normal.  No listhesis. Skull base and vertebrae: Craniocervical alignment is normal. The atlantodental interval is not widened. There is no acute fracture of the cervical spine. 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 No canal hematoma. The spinal canal is otherwise widely patent. No prevertebral soft tissue swelling. No pathologic adenopathy within the neck. No paraspinal fluid collections identified. Disc levels: There is mild intervertebral disc calcifications and posterior disc bulges noted at C3-C7 in keeping with changes of mild degenerative disc disease. Intervertebral disc height is preserved. Vertebral body height is preserved. The prevertebral soft tissues are not thickened on sagittal reformats. 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 Upper chest: Unremarkable Other: None IMPRESSION: No acute intracranial injury.  No calvarial fracture. No acute facial fracture. No acute fracture or listhesis the cervical spine. Electronically Signed   By: Fidela Salisbury MD   On: 05/31/2021 21:03   CT ABDOMEN PELVIS W CONTRAST  Result Date: 05/31/2021 CLINICAL DATA:  Abdominal pain, elevated LFTs and encephalopathy EXAM: CT ANGIOGRAPHY CHEST CT ABDOMEN AND PELVIS WITH CONTRAST TECHNIQUE: Multidetector CT imaging of the chest was performed using the standard protocol during bolus administration of intravenous contrast. Multiplanar CT image reconstructions and MIPs were obtained to evaluate the vascular anatomy. Multidetector CT imaging of the abdomen and pelvis was performed using the standard  protocol during bolus administration of intravenous contrast. CONTRAST:  139m OMNIPAQUE IOHEXOL 350 MG/ML SOLN COMPARISON:  None. FINDINGS: CTA CHEST FINDINGS Cardiovascular: Contrast injection is sufficient to demonstrate satisfactory opacification of the pulmonary arteries to the segmental level.There are small segmental pulmonary emboli within the right upper, middle and lower lobes. On the left, there is a segmental embolus in the left upper lobe. The main pulmonary artery is enlarged, measuring 4.1 cm. There is no CT evidence of acute right heart strain. There is moderate aortic atherosclerosis. There is a normal 3-vessel arch branching pattern. Heart size is normal. No pericardial effusion. There are coronary artery calcifications. Mediastinum/Nodes: No mediastinal, hilar or axillary lymphadenopathy. The visualized thyroid and thoracic esophageal course are unremarkable. Lungs/Pleura: No pulmonary nodules or masses. No pleural effusion or pneumothorax. No focal airspace consolidation. No focal pleural abnormality. Musculoskeletal: No chest wall abnormality. No acute or significant osseous findings. Review of the MIP images confirms the above findings. CT ABDOMEN and PELVIS FINDINGS Hepatobiliary: Innumerable lesions throughout the liver. The largest lesion is in the right hepatic lobe and measures approximately 5.7 cm. Mild gallbladder wall thickening Pancreas: Pancreatic duct is dilated in the distal body and tail. The tail is atrophic. Spleen: Normal. Adrenals/Urinary Tract: --Adrenal glands: Normal. --Right  kidney/ureter: No hydronephrosis or perinephric stranding. No nephrolithiasis. No obstructing ureteral stones. --Left kidney/ureter: No hydronephrosis or perinephric stranding. No nephrolithiasis. No obstructing ureteral stones. --Urinary bladder: Unremarkable. Stomach/Bowel: --Stomach/Duodenum: No hiatal hernia or other gastric abnormality. Normal duodenal course and caliber. --Small bowel: No  dilatation or inflammation. --Colon: No focal abnormality. --Appendix: Not visualized. No right lower quadrant inflammation or free fluid. Vascular/Lymphatic: Aortic atherosclerosis. Aorta bi-iliac stent is patent. 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 No abdominal or pelvic lymphadenopathy. Reproductive: Status post hysterectomy. No adnexal mass. Small volume free fluid in the pelvis. Musculoskeletal. No bony spinal canal stenosis or focal osseous abnormality. Other: None. IMPRESSION: 1. Small segmental pulmonary emboli within the right upper, middle and lower lobes and left upper lobe. No CT evidence of acute right heart strain. 2. Innumerable liver lesions, consistent with metastatic disease. 3. Dilated main pulmonary artery, consistent with pulmonary hypertension. 4. 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 Critical Value/emergent results were called by telephone at the time of interpretation on 05/31/2021 at 9:10 pm to provider DAVID YAO , who verbally acknowledged these results. Aortic Atherosclerosis (ICD10-I70.0). Electronically Signed   By: Ulyses Jarred M.D.   On: 05/31/2021 21:11   CT Maxillofacial Wo Contrast  Result Date: 05/31/2021 CLINICAL DATA:  Mental status change, unknown cause; Neck trauma (Age >= 65y); Facial trauma. Increased confusion, slurred speech and multiple falls over the past week. EXAM: CT HEAD WITHOUT CONTRAST CT MAXILLOFACIAL WITHOUT CONTRAST CT CERVICAL SPINE WITHOUT CONTRAST TECHNIQUE: Multidetector CT imaging of the head, cervical spine, and maxillofacial structures were performed using the standard protocol without intravenous contrast. Multiplanar CT image reconstructions of the cervical spine and maxillofacial structures were also generated. COMPARISON:  None. FINDINGS: CT HEAD FINDINGS Brain: Normal  anatomic configuration. Parenchymal volume loss is commensurate with the patient's age. Moderate periventricular white matter changes are present likely reflecting the sequela of small vessel ischemia. Remote lacunar infarct versus dilated perivascular space within the right basal ganglia. No abnormal intra or extra-axial mass lesion or fluid collection. No abnormal mass effect or midline shift. No evidence of acute intracranial hemorrhage or infarct. Ventricular size is normal. Cerebellum unremarkable. Vascular: No asymmetric hyperdense vasculature at the skull base. Skull: Intact Other: Mastoid air cells and middle ear cavities are clear. CT MAXILLOFACIAL FINDINGS Osseous: Mild motion artifact noted. No acute facial fracture. No mandibular dislocation. Orbits: Negative. No traumatic or inflammatory finding. Sinuses: Clear. Soft tissues: Negative. CT CERVICAL SPINE FINDINGS Alignment: Normal.  No listhesis. Skull base and vertebrae: Craniocervical alignment is normal. The atlantodental interval is not widened. There is no acute fracture of the cervical spine. 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 No canal hematoma. The spinal canal is otherwise widely patent. No prevertebral soft tissue swelling. No pathologic adenopathy within the neck. No paraspinal fluid collections identified. Disc levels: There is mild intervertebral disc calcifications and posterior disc bulges noted at C3-C7 in keeping with changes of mild degenerative disc disease. Intervertebral disc height is preserved. Vertebral body height is preserved. The prevertebral soft tissues are not thickened on sagittal reformats. 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 Upper chest: Unremarkable Other: None IMPRESSION: No  acute intracranial  injury.  No calvarial fracture. No acute facial fracture. No acute fracture or listhesis the cervical spine. Electronically Signed   By: Fidela Salisbury MD   On: 05/31/2021 21:03    Scheduled Meds: Continuous Infusions:  sodium chloride 125 mL/hr at 06/01/21 0821   aztreonam Stopped (06/01/21 0909)   heparin 1,200 Units/hr (06/01/21 3614)   metronidazole Stopped (06/01/21 0450)   vancomycin       LOS: 1 day   Time spent: 35 minutes.  Patrecia Pour, MD [facility] [email or web address] 06/01/2021, 9:42 AM

## 2021-06-01 NOTE — ED Notes (Addendum)
Provider at bedside talking with daughter who remains at bedside. Patient is awake, confused to baseline per daughter, smiles and is alert.

## 2021-06-02 ENCOUNTER — Inpatient Hospital Stay (HOSPITAL_COMMUNITY): Payer: Medicare Other

## 2021-06-02 ENCOUNTER — Inpatient Hospital Stay (HOSPITAL_COMMUNITY): Payer: Medicare Other | Admitting: Anesthesiology

## 2021-06-02 ENCOUNTER — Encounter (HOSPITAL_COMMUNITY)
Admission: EM | Disposition: A | Discharge status: Home Health | Payer: Self-pay | Source: Home / Self Care | Attending: Internal Medicine

## 2021-06-02 ENCOUNTER — Encounter (HOSPITAL_COMMUNITY): Payer: Medicare Other

## 2021-06-02 ENCOUNTER — Encounter (HOSPITAL_COMMUNITY): Payer: Self-pay | Admitting: Internal Medicine

## 2021-06-02 DIAGNOSIS — R1310 Dysphagia, unspecified: Secondary | ICD-10-CM

## 2021-06-02 DIAGNOSIS — I2694 Multiple subsegmental pulmonary emboli without acute cor pulmonale: Secondary | ICD-10-CM

## 2021-06-02 DIAGNOSIS — R935 Abnormal findings on diagnostic imaging of other abdominal regions, including retroperitoneum: Secondary | ICD-10-CM

## 2021-06-02 DIAGNOSIS — K2980 Duodenitis without bleeding: Secondary | ICD-10-CM

## 2021-06-02 DIAGNOSIS — I2693 Single subsegmental pulmonary embolism without acute cor pulmonale: Secondary | ICD-10-CM

## 2021-06-02 DIAGNOSIS — I2699 Other pulmonary embolism without acute cor pulmonale: Secondary | ICD-10-CM

## 2021-06-02 DIAGNOSIS — G9341 Metabolic encephalopathy: Secondary | ICD-10-CM

## 2021-06-02 DIAGNOSIS — K2970 Gastritis, unspecified, without bleeding: Secondary | ICD-10-CM

## 2021-06-02 DIAGNOSIS — C787 Secondary malignant neoplasm of liver and intrahepatic bile duct: Secondary | ICD-10-CM

## 2021-06-02 DIAGNOSIS — I6340 Cerebral infarction due to embolism of unspecified cerebral artery: Secondary | ICD-10-CM

## 2021-06-02 HISTORY — PX: ESOPHAGOGASTRODUODENOSCOPY (EGD) WITH PROPOFOL: SHX5813

## 2021-06-02 HISTORY — PX: IR US GUIDE BX ASP/DRAIN: IMG2392

## 2021-06-02 HISTORY — PX: BIOPSY: SHX5522

## 2021-06-02 LAB — ECHOCARDIOGRAM COMPLETE
AR max vel: 3.2 cm2
AV Area VTI: 2.95 cm2
AV Area mean vel: 2.95 cm2
AV Mean grad: 2 mmHg
AV Peak grad: 4.2 mmHg
Ao pk vel: 1.03 m/s
Area-P 1/2: 3.72 cm2
Height: 67 in
S' Lateral: 2.6 cm
Weight: 2080 [oz_av]

## 2021-06-02 LAB — CBC
HCT: 27.9 % — ABNORMAL LOW (ref 36.0–46.0)
Hemoglobin: 9.3 g/dL — ABNORMAL LOW (ref 12.0–15.0)
MCH: 28.4 pg (ref 26.0–34.0)
MCHC: 33.3 g/dL (ref 30.0–36.0)
MCV: 85.1 fL (ref 80.0–100.0)
Platelets: 147 10*3/uL — ABNORMAL LOW (ref 150–400)
RBC: 3.28 MIL/uL — ABNORMAL LOW (ref 3.87–5.11)
RDW: 16.9 % — ABNORMAL HIGH (ref 11.5–15.5)
WBC: 11.8 10*3/uL — ABNORMAL HIGH (ref 4.0–10.5)
nRBC: 0 % (ref 0.0–0.2)

## 2021-06-02 LAB — URINE CULTURE

## 2021-06-02 LAB — CEA: CEA: 1488 ng/mL — ABNORMAL HIGH (ref 0.0–4.7)

## 2021-06-02 LAB — HEPARIN LEVEL (UNFRACTIONATED): Heparin Unfractionated: <0.1 IU/mL — ABNORMAL LOW (ref 0.30–0.70)

## 2021-06-02 LAB — CA 125: Cancer Antigen (CA) 125: 4263 U/mL — ABNORMAL HIGH (ref 0.0–38.1)

## 2021-06-02 LAB — CANCER ANTIGEN 19-9: CA 19-9: 395410 U/mL — ABNORMAL HIGH (ref 0–35)

## 2021-06-02 SURGERY — ESOPHAGOGASTRODUODENOSCOPY (EGD) WITH PROPOFOL
Anesthesia: Monitor Anesthesia Care

## 2021-06-02 MED ORDER — MIDAZOLAM HCL 2 MG/2ML IJ SOLN
INTRAMUSCULAR | Status: AC | PRN
  Administered 2021-06-02: 1 mg via INTRAVENOUS
  Administered 2021-06-02: 0.5 mg via INTRAVENOUS

## 2021-06-02 MED ORDER — GELATIN ABSORBABLE 12-7 MM EX MISC
CUTANEOUS | Status: AC
  Filled 2021-06-02: qty 1

## 2021-06-02 MED ORDER — FENTANYL CITRATE (PF) 100 MCG/2ML IJ SOLN
INTRAMUSCULAR | Status: AC
  Filled 2021-06-02: qty 2

## 2021-06-02 MED ORDER — ATORVASTATIN CALCIUM 80 MG PO TABS
80.0000 mg | ORAL_TABLET | Freq: Every day | ORAL | Status: DC
  Administered 2021-06-02 – 2021-06-03 (×2): 80 mg via ORAL
  Filled 2021-06-02 (×2): qty 1

## 2021-06-02 MED ORDER — ENSURE ENLIVE PO LIQD
237.0000 mL | Freq: Three times a day (TID) | ORAL | Status: DC
  Administered 2021-06-03 – 2021-06-06 (×6): 237 mL via ORAL

## 2021-06-02 MED ORDER — PANTOPRAZOLE SODIUM 40 MG PO TBEC
40.0000 mg | DELAYED_RELEASE_TABLET | Freq: Every day | ORAL | Status: DC
Start: 2021-06-02 — End: 2021-06-04
  Administered 2021-06-02 – 2021-06-03 (×2): 40 mg via ORAL
  Filled 2021-06-02 (×2): qty 1

## 2021-06-02 MED ORDER — HYDROCODONE-ACETAMINOPHEN 7.5-325 MG PO TABS
1.0000 | ORAL_TABLET | Freq: Three times a day (TID) | ORAL | Status: DC | PRN
Start: 2021-06-02 — End: 2021-06-04
  Administered 2021-06-02 – 2021-06-04 (×6): 1 via ORAL
  Filled 2021-06-02 (×6): qty 1

## 2021-06-02 MED ORDER — LACTATED RINGERS IV SOLN: INTRAVENOUS | Status: DC | PRN

## 2021-06-02 MED ORDER — ADULT MULTIVITAMIN W/MINERALS CH
1.0000 | ORAL_TABLET | Freq: Every day | ORAL | Status: DC
  Administered 2021-06-02: 1 via ORAL
  Filled 2021-06-02 (×2): qty 1

## 2021-06-02 MED ORDER — FENTANYL CITRATE (PF) 100 MCG/2ML IJ SOLN
INTRAMUSCULAR | Status: AC | PRN
  Administered 2021-06-02: 25 ug via INTRAVENOUS
  Administered 2021-06-02: 50 ug via INTRAVENOUS

## 2021-06-02 MED ORDER — LIDOCAINE HCL 1 % IJ SOLN
INTRAMUSCULAR | Status: AC
  Filled 2021-06-02: qty 20

## 2021-06-02 MED ORDER — LACTATED RINGERS IV SOLN
INTRAVENOUS | Status: AC | PRN
  Administered 2021-06-02: 1000 mL via INTRAVENOUS

## 2021-06-02 MED ORDER — MIDAZOLAM HCL 2 MG/2ML IJ SOLN
INTRAMUSCULAR | Status: AC
  Filled 2021-06-02: qty 2

## 2021-06-02 MED ORDER — PROPOFOL 10 MG/ML IV BOLUS
INTRAVENOUS | Status: DC | PRN
  Administered 2021-06-02 (×2): 20 mg via INTRAVENOUS

## 2021-06-02 MED ORDER — HEPARIN (PORCINE) 25000 UT/250ML-% IV SOLN
1400.0000 [IU]/h | INTRAVENOUS | Status: DC
  Administered 2021-06-02: 1300 [IU]/h via INTRAVENOUS
  Administered 2021-06-03 – 2021-06-05 (×4): 1400 [IU]/h via INTRAVENOUS
  Filled 2021-06-02 (×5): qty 250

## 2021-06-02 MED ORDER — PROPOFOL 500 MG/50ML IV EMUL
INTRAVENOUS | Status: DC | PRN
  Administered 2021-06-02: 100 ug/kg/min via INTRAVENOUS

## 2021-06-02 SURGICAL SUPPLY — 14 items

## 2021-06-02 NOTE — Evaluation (Signed)
Physical Therapy Evaluation Patient Details Name: TIESHIA RACKARD MRN: [id number] DOB: 10/20/1946 Today's Date: 06/02/2021   History of Present Illness  75 y.o. female presents to [facility] ED with reports of confusion, slurred speech, abdominal pain, falls, and dysphagia. Pt recently diagnosed with liver mets with unknown primary malignancy. Pt found to have acute subsegmental PE. MRI 8/4 shows Multifocal bilateral acute or early subacute ischemia within the  MCA territories. Pt underwent upper GI endoscopy on 8/5. PMH includes dementia, AAA repair, A. fib not on anticoagulation, COPD, hypertension, hyperlipidemia, dementia.  Clinical Impression  Pt presents to PT with deficits in functional mobility, gait, balance, power, strength, endurance, cognition, safety awareness, and with significant pain. Pt initially requires little physical assistance to perform bed mobility and transfer, however as session progresses pt fatigues and becomes more impulsive, requiring increased assistance for safety. Pt will benefit from aggressive mobilization and PT POC to improve activity tolerance and reduce falls risk. PT recommends HHPT at this time, however if pt does not progress or decline SNF placement may need to be considered.    Follow Up Recommendations Home health PT;Supervision/Assistance - 24 hour    Equipment Recommendations  3in1 (PT)    Recommendations for Other Services       Precautions / Restrictions Precautions Precautions: Fall Restrictions Weight Bearing Restrictions: No      Mobility  Bed Mobility Overal bed mobility: Needs Assistance Bed Mobility: Supine to Sit;Sit to Supine     Supine to sit: Min assist Sit to supine: Min assist        Transfers Overall transfer level: Needs assistance Equipment used: 1 person hand held assist;Rolling walker (2 wheeled) Transfers: Sit to/from [facility] Sit to Stand: Min assist Stand pivot transfers: Min assist           Ambulation/Gait Ambulation/Gait assistance: Min assist Gait Distance (Feet): 3 Feet Assistive device: Rolling walker (2 wheeled) Gait Pattern/deviations: Step-to pattern Gait velocity: reduced Gait velocity interpretation: <1.31 ft/sec, indicative of household ambulator General Gait Details: pt with poor walker management, walking to bed rather than PT direction toward doorway  Stairs            Wheelchair Mobility    Modified Rankin (Stroke Patients Only) Modified Rankin (Stroke Patients Only) Pre-Morbid Rankin Score: Moderate disability Modified Rankin: Moderately severe disability     Balance Overall balance assessment: Needs assistance Sitting-balance support: No upper extremity supported;Feet supported Sitting balance-Leahy Scale: Fair     Standing balance support: Single extremity supported;Bilateral upper extremity supported Standing balance-Leahy Scale: Poor Standing balance comment: reliant on UE support                             Pertinent Vitals/Pain Pain Assessment: Faces Faces Pain Scale: Hurts whole lot Pain Location: back Pain Descriptors / Indicators: Grimacing Pain Intervention(s): Monitored during session    Home Living Family/patient expects to be discharged to:: Private residence Living Arrangements: Children Available Help at Discharge: Family;Available 24 hours/day Type of Home: House Home Access: Stairs to enter Entrance Stairs-Rails: Psychiatric nurse of Steps: 4 Home Layout: One level Home Equipment: Walker - 2 wheels;Walker - 4 wheels;Cane - single point      Prior Function Level of Independence: Needs assistance   Gait / Transfers Assistance Needed: pt ambulates with use of cane, transfers independently, requires assistance for stair negotiation           Hand Dominance  Extremity/Trunk Assessment   Upper Extremity Assessment Upper Extremity Assessment: Generalized weakness     Lower Extremity Assessment Lower Extremity Assessment: Generalized weakness    Cervical / Trunk Assessment Cervical / Trunk Assessment: Kyphotic  Communication   Communication: No difficulties  Cognition Arousal/Alertness: Awake/alert Behavior During Therapy: Impulsive Overall Cognitive Status: Impaired/Different from baseline Area of Impairment: Attention;Following commands;Safety/judgement;Awareness                   Current Attention Level: Sustained   Following Commands: Follows one step commands inconsistently;Follows one step commands with increased time (follows one-step commands with increased time initially, with fatigue command following becomes less consistent) Safety/Judgement: Decreased awareness of safety;Decreased awareness of deficits Awareness: Intellectual          General Comments General comments (skin integrity, edema, etc.): VSS on RA    Exercises     Assessment/Plan    PT Assessment Patient needs continued PT services  PT Problem List Decreased strength;Decreased activity tolerance;Decreased balance;Decreased mobility;Decreased cognition;Decreased knowledge of use of DME;Decreased safety awareness;Decreased knowledge of precautions;Pain       PT Treatment Interventions DME instruction;Gait training;Stair training;Functional mobility training;Therapeutic activities;Neuromuscular re-education;Balance training;Therapeutic exercise;Patient/family education    PT Goals (Current goals can be found in the Care Plan section)  Acute Rehab PT Goals Patient Stated Goal: to improve mobility and return home PT Goal Formulation: With family Time For Goal Achievement: 06/16/21 Potential to Achieve Goals: Fair    Frequency Min 4X/week   Barriers to discharge        Co-evaluation               AM-PAC PT "6 Clicks" Mobility  Outcome Measure Help needed turning from your back to your side while in a flat bed without using bedrails?: A  Little Help needed moving from lying on your back to sitting on the side of a flat bed without using bedrails?: A Little Help needed moving to and from a bed to a chair (including a wheelchair)?: A Little Help needed standing up from a chair using your arms (e.g., wheelchair or bedside chair)?: A Little Help needed to walk in hospital room?: A Lot Help needed climbing 3-5 steps with a railing? : A Lot 6 Click Score: 16    End of Session   Activity Tolerance: Patient limited by fatigue Patient left: in bed;with call bell/phone within reach;with bed alarm set Nurse Communication: Mobility status PT Visit Diagnosis: Unsteadiness on feet (R26.81);Muscle weakness (generalized) (M62.81);Other symptoms and signs involving the nervous system (R29.898);Pain Pain - part of body:  (back)    Time: [id number] PT Time Calculation (min) (ACUTE ONLY): 26 min   Charges:   PT Evaluation $PT Eval Moderate Complexity: 1 Mod          Zenaida Niece, PT, DPT Acute Rehabilitation Pager: [phone]   Zenaida Niece 06/02/2021, 3:56 PM

## 2021-06-02 NOTE — Progress Notes (Signed)
OT Cancellation Note  Patient Details Name: Anne West MRN: [id number] DOB: October 19, 1946   Cancelled Treatment:    Reason Eval/Treat Not Completed: Medical issues which prohibited therapy. Per RN Poonam, pt is in extreme pain at this time, just returned from upper endoscopy, and will be leaving for Liver biopsy later this morning. Acute OT will follow up as time allows.   Tyree Fluharty H., OTR/L Acute Rehabilitation  Ameia Morency Elane Yolanda Bonine 06/02/2021, 9:28 AM

## 2021-06-02 NOTE — Progress Notes (Signed)
  Echocardiogram 2D Echocardiogram has been performed.  Anne West 06/02/2021, 10:23 AM

## 2021-06-02 NOTE — Anesthesia Postprocedure Evaluation (Signed)
Anesthesia Post Note  Patient: Anne West  Procedure(s) Performed: ESOPHAGOGASTRODUODENOSCOPY (EGD) WITH PROPOFOL possible Dilation BIOPSY     Patient location during evaluation: Endoscopy Anesthesia Type: General Level of consciousness: awake and alert Pain management: pain level controlled Vital Signs Assessment: post-procedure vital signs reviewed and stable Respiratory status: spontaneous breathing, nonlabored ventilation, respiratory function stable and patient connected to nasal cannula oxygen Cardiovascular status: blood pressure returned to baseline and stable Postop Assessment: no apparent nausea or vomiting Anesthetic complications: no   No notable events documented.  Last Vitals:  Vitals:   06/02/21 0701 06/02/21 0756  BP: (!) 147/48 (!) 108/34  Pulse: 67 66  Resp: 19 (!) 22  Temp: 37.3 C (!) 36.4 C  SpO2: 100% 100%    Last Pain:  Vitals:   06/02/21 0756  TempSrc: Temporal  PainSc:                  Barnet [place]

## 2021-06-02 NOTE — Progress Notes (Signed)
PROGRESS NOTE  Anne West  EML:[id number] DOB: [birth year] DOA: 05/31/2021 PCP: Default, Provider, MD   Brief Narrative: Anne West is a 75 y.o. female with a history of AFib not on anticoagulation, COPD, HTN, HLD, cognitive impairment, AAA s/p repair, and recent hospitalization for abdominal pain thought to be due to constipation where she was found to have numerous liver lesions and thickened gallbladder without other evidence of cholecystitis. She returned home and has continued to eat poorly, grow more confused than baseline and have several falls. Due to this and some slurred speech, her daughter brought her to the ED where she was febrile at 100.65F, tachycardic (sinus), without hypotension or hypoxia. WBC 14.4k, hgb 10.4g/dl, platelets 103k. SCr 0.7. AST 133, ALT 145, alk phos 814, TBili 2.5. Covid, flue PCR negative. Vast imaging evaluation revealed hepatic lesions, stable gallbladder wall thickening without cholelithiasis or pericholecystic fluid to suggest cholecystitis, and small bilateral pulmonary emboli.   Blood cultures were drawn, broad IV antibiotics started for SIRS (NOS), IV heparin was started for PE. GI was consulted for dysphagia, performed EGD 06/02/2021 showing non-bleeding gastroduodenitis, which was biopsied, without evidence of malignancy (full report below). IR is consulted and will perform U/S-guided biopsy of liver lesion. Oncology has made recommendations to complete initial work up and will follow up with the patient after pathology results have returned.  Assessment & Plan: Principal Problem:   Acute pulmonary embolism (HCC) Active Problems:   Sepsis ([place])   Acute metabolic encephalopathy   Elevated liver enzymes   Anemia   Metastases to the liver [facility])   Dysphagia   Abnormal CT of the abdomen  Falls, increasing confusion, slurred speech: CT head, maxillofacial, and cervical spine without acute abnormality/hemorrhage, dislocation or fracture.  Subsequent MRI did reveal significant findings of acute and early subacute infarcts.  - Delirium precautions.   - PT/OT  Bilateral MCA CVAs, acute cerebellar CVA: Distribution consistent with embolic showering. - Neurology consulted - Echo is pending - Check lipid panel, HbA1c. Further work up per neuro. - Continue cardiac monitoring, though pt has known atrial fibrillation.  - PT, OT, SLP ordered - Augment statin, pt on anticoagulation currently.  SIRS: Unclear source at this time, Tmax is 100.84F . No focal RUQ tenderness, though GB wall is thickened without clear etiology. It is possible that the findings supporting this diagnosis are attributable to VTE, metastatic cancer, and dehydration. PCT notably elevated. - Continue empiric abx, especially as we consider biopsy and await culture results.  - Trend leukocytosis. Improving. - ?If HIDA would be helpful.  Acute pulmonary emboli: Provoked by malignancy. Not hemodynamically compromising or causing hypoxia.  - Continue IV heparin which can be paused if needed for biopsy. - Check echocardiogram  Metastatic cancer, unknown primary: Numerous hepatic lesions on CT.  - IR biopsy planned 8/5.  - CA-125 4,263 and CEA 1,488 grossly elevated. - Oncology, Dr. Chryl Heck, consulted and discussed with the family by phone the plan for expedited oncology clinic follow up once biopsy results are back.  Unintentional weight loss, poor per oral intake: Likely related to metastatic cancer, no anatomical explanation found on EGD.  - Dietitian consult  Gastroduodenitis:  - Continue PPI, advance diet as tolerated per GI who signed off.   HLD: LFT elevation noted as above, likely due to metastases. CK wnl.  - Check lipid panel, start high-intensity statin (atorvastatin 76m > 874m  COPD:  - Prn BDs. Continue trelegy    HTN:  - Hold norvasc 34m50mbut  continue AV nodal agents due to PAF but permissive HTN otherwise.  PAF: Has remained in sinus rhythm  (ST > NSR).  - Initiating AC as above.  - Continue home diltiazem, metoprolol 5m qd.  Dementia with acute delirium: Had delirium during other hospitalizations as well. - Continue exelon  Thrombocytopenia: Stable/improving - Continue monitoring level and for bleeding.   Normocytic anemia: Possibly due to metastatic disease. B12 sufficient, ferritin grossly elevated with low TIBC - Monitor telemetry  DVT prophylaxis: IV heparin (holding periprocedurally) Code Status: Full Family Communication: Daughter at bedside Disposition Plan:  Status is: Inpatient  Remains inpatient appropriate because:Altered mental status, Ongoing diagnostic testing needed not appropriate for outpatient work up, and Unsafe d/c plan  Dispo: The patient is from: Home              Anticipated d/c is to:  TBD  - PT/OT pending.              Patient currently is not medically stable to d/c.   Difficult to place patient No  Consultants:  GI Oncology IR  Procedures:  EGD 06/02/2021 Dr. PHilarie Fredrickson Impression:        - Normal esophagus.                           - Gastroduodenitis without bleeding. Biopsied.                           - No upper GI source of malignancy. Recommendation:- Return patient to hospital ward for ongoing care.                           - Advance diet as tolerated and liver biopsy.                           - Continue present medications. Resume heparin when cleared by IR after liver biopsy.                           - Await pathology results.                           - Current plan is for IR guided liver biopsy of                           liver tumor. At present no imminent plans for                           colonoscopy based on patient's overall current                           condition, no tumor seen on multiple CTs, and plan                           for biopsy of liver mass. Anticipate oncology                           consult once diagnosis confirmed.                            -  GI will sign off, but remain available for                           questions.  Antimicrobials: Vancomycin, aztreonam, flagyl   Subjective: Pt had some agitation last night for the time that her daughter had to leave the bedside, much improved otherwise. Has some abdominal discomfort which has been going on. No dyspnea or chest pain reported. Tolerated EGD. No bleeding noted.  Objective: Vitals:   06/02/21 0756 06/02/21 0805 06/02/21 0815 06/02/21 0845  BP: (!) 108/34 (!) 120/33 (!) 116/34 (!) 115/50  Pulse: 66 62 64 75  Resp: (!) 22 (!) 22 (!) 21 16  Temp: (!) 97.5 F (36.4 C)   98.8 F (37.1 C)  TempSrc: Temporal   Oral  SpO2: 100% 98% 100% 100%  Weight:      Height:        Intake/Output Summary (Last 24 hours) at 06/02/2021 1020 Last data filed at 06/02/2021 0752 Gross per 24 hour  Intake 3104.07 ml  Output 1300 ml  Net 1804.07 ml   Filed Weights   05/31/21 2100  Weight: 59 kg   Gen: 75 y.o. female in no distress Pulm: Nonlabored breathing room air. Clear. CV: Regular rate and rhythm. No JVD, no pitting dependent edema. GI: Abdomen nondistended Ext: Warm, no deformities Skin: No new rashes, lesions or ulcers on visualized skin. Neuro: Alert and incompletely oriented, interactive and follows commands slowly Psych: Judgement and insight appear impaired. Mood euthymic & affect congruent.    Data Reviewed: I have personally reviewed following labs and imaging studies  CBC: Recent Labs  Lab 05/31/21 1711 06/01/21 0556 06/02/21 0233  WBC 14.4* 13.6* 11.8*  NEUTROABS 10.3*  --   --   HGB 10.4* 9.5* 9.3*  HCT 31.2* 30.4* 27.9*  MCV 86.0 91.3 85.1  PLT 103* 112* 254*   Basic Metabolic Panel: Recent Labs  Lab 05/31/21 1711 06/01/21 0556  NA 134* 134*  K 3.7 3.3*  CL 92* 98  CO2 28 23  GLUCOSE 102* 111*  BUN 21 17  CREATININE 0.71 0.53  CALCIUM 8.8* 8.2*   GFR: Estimated Creatinine Clearance: 54.9 mL/min (by C-G formula based on SCr of 0.53  mg/dL). Liver Function Tests: Recent Labs  Lab 05/31/21 1711 06/01/21 0556  AST 133* 110*  ALT 45* 41  ALKPHOS 814* 565*  BILITOT 2.5* 2.6*  PROT 6.9 6.0*  ALBUMIN 2.6* 2.3*   No results for input(s): LIPASE, AMYLASE in the last 168 hours. Recent Labs  Lab 06/01/21 1605  AMMONIA 51*   Coagulation Profile: Recent Labs  Lab 05/31/21 1711  INR 1.5*   Cardiac Enzymes: Recent Labs  Lab 05/31/21 1803  CKTOTAL 81   BNP (last 3 results) No results for input(s): PROBNP in the last 8760 hours. HbA1C: No results for input(s): HGBA1C in the last 72 hours. CBG: No results for input(s): GLUCAP in the last 168 hours. Lipid Profile: No results for input(s): CHOL, HDL, LDLCALC, TRIG, CHOLHDL, LDLDIRECT in the last 72 hours. Thyroid Function Tests: Recent Labs    06/01/21 0653  TSH 2.222   Anemia Panel: Recent Labs    06/01/21 0556 06/01/21 0653  VITAMINB12  --  3,278*  FOLATE  --  13.0  FERRITIN  --  1,079*  TIBC  --  155*  IRON  --  19*  RETICCTPCT 3.6*  --    Urine analysis:    Component Value Date/Time  COLORURINE YELLOW 06/01/2021 0224   APPEARANCEUR HAZY (A) 06/01/2021 0224   LABSPEC 1.043 (H) 06/01/2021 0224   PHURINE 5.0 06/01/2021 0224   GLUCOSEU NEGATIVE 06/01/2021 0224   HGBUR MODERATE (A) 06/01/2021 0224   BILIRUBINUR NEGATIVE 06/01/2021 0224   KETONESUR 5 (A) 06/01/2021 0224   PROTEINUR 30 (A) 06/01/2021 0224   UROBILINOGEN 1.0 09/17/2011 1948   NITRITE NEGATIVE 06/01/2021 0224   LEUKOCYTESUR NEGATIVE 06/01/2021 0224   Recent Results (from the past 240 hour(s))  Blood Culture (routine x 2)     Status: None (Preliminary result)   Collection Time: 05/31/21  5:08 PM   Specimen: BLOOD  Result Value Ref Range Status   Specimen Description BLOOD LEFT ANTECUBITAL  Final   Special Requests   Final    BOTTLES DRAWN AEROBIC AND ANAEROBIC Blood Culture adequate volume   Culture   Final    NO GROWTH 2 DAYS Performed at [facility], [place] [address] [place], [place] [address]    Report Status PENDING  Incomplete  Blood Culture (routine x 2)     Status: None (Preliminary result)   Collection Time: 05/31/21  5:11 PM   Specimen: BLOOD  Result Value Ref Range Status   Specimen Description BLOOD LEFT ANTECUBITAL  Final   Special Requests   Final    AEROBIC BOTTLE ONLY Blood Culture results may not be optimal due to an inadequate volume of blood received in culture bottles   Culture   Final    NO GROWTH 2 DAYS Performed at [facility], [place] [address], [place], [place] [address]    Report Status PENDING  Incomplete  Resp Panel by RT-PCR (Flu A&B, Covid) Nasopharyngeal Swab     Status: None   Collection Time: 05/31/21  7:12 PM   Specimen: Nasopharyngeal Swab; Nasopharyngeal(NP) swabs in vial transport medium  Result Value Ref Range Status   SARS Coronavirus 2 by RT PCR NEGATIVE NEGATIVE Final    Comment: (NOTE) SARS-CoV-2 target nucleic acids are NOT DETECTED.  The SARS-CoV-2 RNA is generally detectable in upper respiratory specimens during the acute phase of infection. The lowest concentration of SARS-CoV-2 viral copies this assay can detect is 138 copies/mL. A negative result does not preclude SARS-Cov-2 infection and should not be used as the sole basis for treatment or other patient management decisions. A negative result may occur with  improper specimen collection/handling, submission of specimen other than nasopharyngeal swab, presence of viral mutation(s) within the areas targeted by this assay, and inadequate number of viral copies(<138 copies/mL). A negative result must be combined with clinical observations, patient history, and epidemiological information. The expected result is Negative.  Fact Sheet for Patients:  EntrepreneurPulse.com.au  Fact Sheet for Healthcare Providers:  IncredibleEmployment.be  This test is no t yet approved or cleared by the [place] FDA  and  has been authorized for detection and/or diagnosis of SARS-CoV-2 by FDA under an Emergency Use Authorization (EUA). This EUA will remain  in effect (meaning this test can be used) for the duration of the COVID-19 declaration under Section 564(b)(1) of the Act, 21 U.S.C.section 360bbb-3(b)(1), unless the authorization is terminated  or revoked sooner.       Influenza A by PCR NEGATIVE NEGATIVE Final   Influenza B by PCR NEGATIVE NEGATIVE Final    Comment: (NOTE) The Xpert Xpress SARS-CoV-2/FLU/RSV plus assay is intended as an aid in the diagnosis of influenza from Nasopharyngeal swab specimens and should not be used as a  sole basis for treatment. Nasal washings and aspirates are unacceptable for Xpert Xpress SARS-CoV-2/FLU/RSV testing.  Fact Sheet for Patients: EntrepreneurPulse.com.au  Fact Sheet for Healthcare Providers: IncredibleEmployment.be  This test is not yet approved or cleared by the [place] FDA and has been authorized for detection and/or diagnosis of SARS-CoV-2 by FDA under an Emergency Use Authorization (EUA). This EUA will remain in effect (meaning this test can be used) for the duration of the COVID-19 declaration under Section 564(b)(1) of the Act, 21 U.S.C. section 360bbb-3(b)(1), unless the authorization is terminated or revoked.  Performed at [facility], [place] [address], [place], [place] [address]   Urine Culture     Status: Abnormal   Collection Time: 06/01/21  2:42 AM   Specimen: In/Out Cath Urine  Result Value Ref Range Status   Specimen Description IN/OUT CATH URINE  Final   Special Requests   Final    NONE Performed at [facility], [place] [address] [place],  [address]    Culture MULTIPLE SPECIES PRESENT, SUGGEST RECOLLECTION (A)  Final   Report Status 06/02/2021 FINAL  Final      Radiology Studies: DG Chest 1 View  Result Date: 05/31/2021 CLINICAL DATA:  Multiple falls, slurred  speech, abdominal pain EXAM: CHEST  1 VIEW COMPARISON:  05/16/2021 FINDINGS: Single frontal view of the chest demonstrates an unremarkable cardiac silhouette. No acute airspace disease, effusion, or pneumothorax. Chronic interstitial scarring is noted. No acute bony abnormalities. Partial visualization of aortic stent. IMPRESSION: 1. No acute intrathoracic process. Electronically Signed   By: Randa Ngo M.D.   On: 05/31/2021 18:28   CT HEAD WO CONTRAST (5MM)  Result Date: 05/31/2021 CLINICAL DATA:  Mental status change, unknown cause; Neck trauma (Age >= 65y); Facial trauma. Increased confusion, slurred speech and multiple falls over the past week. EXAM: CT HEAD WITHOUT CONTRAST CT MAXILLOFACIAL WITHOUT CONTRAST CT CERVICAL SPINE WITHOUT CONTRAST TECHNIQUE: Multidetector CT imaging of the head, cervical spine, and maxillofacial structures were performed using the standard protocol without intravenous contrast. Multiplanar CT image reconstructions of the cervical spine and maxillofacial structures were also generated. COMPARISON:  None. FINDINGS: CT HEAD FINDINGS Brain: Normal anatomic configuration. Parenchymal volume loss is commensurate with the patient's age. Moderate periventricular white matter changes are present likely reflecting the sequela of small vessel ischemia. Remote lacunar infarct versus dilated perivascular space within the right basal ganglia. No abnormal intra or extra-axial mass lesion or fluid collection. No abnormal mass effect or midline shift. No evidence of acute intracranial hemorrhage or infarct. Ventricular size is normal. Cerebellum unremarkable. Vascular: No asymmetric hyperdense vasculature at the skull base. Skull: Intact Other: Mastoid air cells and middle ear cavities are clear. CT MAXILLOFACIAL FINDINGS Osseous: Mild motion artifact noted. No acute facial fracture. No mandibular dislocation. Orbits: Negative. No traumatic or inflammatory finding. Sinuses: Clear. Soft  tissues: Negative. CT CERVICAL SPINE FINDINGS Alignment: Normal.  No listhesis. Skull base and vertebrae: Craniocervical alignment is normal. The atlantodental interval is not widened. There is no acute fracture of the cervical spine. 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 No canal hematoma. The spinal canal is otherwise widely patent. No prevertebral soft tissue swelling. No pathologic adenopathy within the neck. No paraspinal fluid collections identified. Disc levels: There is mild intervertebral disc calcifications and posterior disc bulges noted at C3-C7 in keeping with changes of mild degenerative disc disease.  Intervertebral disc height is preserved. Vertebral body height is preserved. The prevertebral soft tissues are not thickened on sagittal reformats. 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 Upper chest: Unremarkable Other: None IMPRESSION: No acute intracranial injury.  No calvarial fracture. No acute facial fracture. No acute fracture or listhesis the cervical spine. Electronically Signed   By: Fidela Salisbury MD   On: 05/31/2021 21:03   CT Angio Chest PE W and/or Wo Contrast  Result Date: 05/31/2021 CLINICAL DATA:  Abdominal pain, elevated LFTs and encephalopathy EXAM: CT ANGIOGRAPHY CHEST CT ABDOMEN AND PELVIS WITH CONTRAST TECHNIQUE: Multidetector CT imaging of the chest was performed using the standard protocol during bolus administration of intravenous contrast. Multiplanar CT image reconstructions and MIPs were obtained to evaluate the vascular anatomy. Multidetector CT imaging of the abdomen and pelvis was performed using the standard protocol during bolus administration of intravenous contrast. CONTRAST:  146m OMNIPAQUE IOHEXOL 350 MG/ML SOLN COMPARISON:   None. FINDINGS: CTA CHEST FINDINGS Cardiovascular: Contrast injection is sufficient to demonstrate satisfactory opacification of the pulmonary arteries to the segmental level.There are small segmental pulmonary emboli within the right upper, middle and lower lobes. On the left, there is a segmental embolus in the left upper lobe. The main pulmonary artery is enlarged, measuring 4.1 cm. There is no CT evidence of acute right heart strain. There is moderate aortic atherosclerosis. There is a normal 3-vessel arch branching pattern. Heart size is normal. No pericardial effusion. There are coronary artery calcifications. Mediastinum/Nodes: No mediastinal, hilar or axillary lymphadenopathy. The visualized thyroid and thoracic esophageal course are unremarkable. Lungs/Pleura: No pulmonary nodules or masses. No pleural effusion or pneumothorax. No focal airspace consolidation. No focal pleural abnormality. Musculoskeletal: No chest wall abnormality. No acute or significant osseous findings. Review of the MIP images confirms the above findings. CT ABDOMEN and PELVIS FINDINGS Hepatobiliary: Innumerable lesions throughout the liver. The largest lesion is in the right hepatic lobe and measures approximately 5.7 cm. Mild gallbladder wall thickening Pancreas: Pancreatic duct is dilated in the distal body and tail. The tail is atrophic. Spleen: Normal. Adrenals/Urinary Tract: --Adrenal glands: Normal. --Right kidney/ureter: No hydronephrosis or perinephric stranding. No nephrolithiasis. No obstructing ureteral stones. --Left kidney/ureter: No hydronephrosis or perinephric stranding. No nephrolithiasis. No obstructing ureteral stones. --Urinary bladder: Unremarkable. Stomach/Bowel: --Stomach/Duodenum: No hiatal hernia or other gastric abnormality. Normal duodenal course and caliber. --Small bowel: No dilatation or inflammation. --Colon: No focal abnormality. --Appendix: Not visualized. No right lower quadrant inflammation or free  fluid. Vascular/Lymphatic: Aortic atherosclerosis. Aorta bi-iliac stent is patent. 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 No abdominal or pelvic lymphadenopathy. Reproductive: Status post hysterectomy. No adnexal mass. Small volume free fluid in the pelvis. Musculoskeletal. No bony spinal canal stenosis or focal osseous abnormality. Other: None. IMPRESSION: 1. Small segmental pulmonary emboli within the right upper, middle and lower lobes and left upper lobe. No CT evidence of acute right heart strain. 2. Innumerable liver lesions, consistent with metastatic disease. 3. Dilated main pulmonary artery, consistent with pulmonary hypertension. 4. 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 Critical Value/emergent results were called by telephone at the time of interpretation on 05/31/2021 at 9:10 pm to provider DAVID YAO , who verbally acknowledged these results. Aortic Atherosclerosis (ICD10-I70.0). Electronically Signed   By: KCletus GashD.  On: 05/31/2021 21:11   CT Cervical Spine Wo Contrast  Result Date: 05/31/2021 CLINICAL DATA:  Mental status change, unknown cause; Neck trauma (Age >= 65y); Facial trauma. Increased confusion, slurred speech and multiple falls over the past week. EXAM: CT HEAD WITHOUT CONTRAST CT MAXILLOFACIAL WITHOUT CONTRAST CT CERVICAL SPINE WITHOUT CONTRAST TECHNIQUE: Multidetector CT imaging of the head, cervical spine, and maxillofacial structures were performed using the standard protocol without intravenous contrast. Multiplanar CT image reconstructions of the cervical spine and maxillofacial structures were also generated. COMPARISON:  None. FINDINGS: CT HEAD FINDINGS Brain: Normal anatomic configuration. Parenchymal volume loss is commensurate with the patient's age. Moderate periventricular white matter  changes are present likely reflecting the sequela of small vessel ischemia. Remote lacunar infarct versus dilated perivascular space within the right basal ganglia. No abnormal intra or extra-axial mass lesion or fluid collection. No abnormal mass effect or midline shift. No evidence of acute intracranial hemorrhage or infarct. Ventricular size is normal. Cerebellum unremarkable. Vascular: No asymmetric hyperdense vasculature at the skull base. Skull: Intact Other: Mastoid air cells and middle ear cavities are clear. CT MAXILLOFACIAL FINDINGS Osseous: Mild motion artifact noted. No acute facial fracture. No mandibular dislocation. Orbits: Negative. No traumatic or inflammatory finding. Sinuses: Clear. Soft tissues: Negative. CT CERVICAL SPINE FINDINGS Alignment: Normal.  No listhesis. Skull base and vertebrae: Craniocervical alignment is normal. The atlantodental interval is not widened. There is no acute fracture of the cervical spine. 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 No canal hematoma. The spinal canal is otherwise widely patent. No prevertebral soft tissue swelling. No pathologic adenopathy within the neck. No paraspinal fluid collections identified. Disc levels: There is mild intervertebral disc calcifications and posterior disc bulges noted at C3-C7 in keeping with changes of mild degenerative disc disease. Intervertebral disc height is preserved. Vertebral body height is preserved. The prevertebral soft tissues are not thickened on sagittal reformats. 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 Upper chest: Unremarkable Other: None IMPRESSION: No acute intracranial injury.  No calvarial fracture. No acute facial fracture. No acute fracture or listhesis the cervical spine.  Electronically Signed   By: Fidela Salisbury MD   On: 05/31/2021 21:03   MR BRAIN WO CONTRAST  Result Date: 06/01/2021 CLINICAL DATA:  Neuro deficit, acute, stroke suspected EXAM: MRI HEAD WITHOUT CONTRAST TECHNIQUE: Multiplanar, multiecho pulse sequences of the brain and surrounding structures were obtained without intravenous contrast. COMPARISON:  None. FINDINGS: Brain: Multifocal bilateral acute or early subacute ischemia within the MCA territories. There is also punctate focus of acute ischemia in the cerebellar vermis. No acute or chronic hemorrhage. There is multifocal hyperintense T2-weighted signal within the white matter. Generalized volume loss without a clear lobar predilection. The midline structures are normal. Vascular: Major flow voids are preserved. Skull and upper cervical spine: Normal calvarium and skull base. Visualized upper cervical spine and soft tissues are normal. Sinuses/Orbits:No paranasal sinus fluid levels or advanced mucosal thickening. No mastoid or middle ear effusion. Normal orbits. IMPRESSION: 1. Multifocal bilateral acute or early subacute ischemia within the MCA territories. No hemorrhage or mass effect. 2. Punctate focus of acute ischemia in the cerebellar vermis. 3. Atrophy and findings of chronic microvascular ischemia. Electronically Signed   By: Ulyses Jarred M.D.   On: 06/01/2021 22:26   CT ABDOMEN PELVIS W CONTRAST  Result Date: 05/31/2021 CLINICAL DATA:  Abdominal pain, elevated LFTs and encephalopathy EXAM: CT ANGIOGRAPHY CHEST CT ABDOMEN AND PELVIS WITH CONTRAST TECHNIQUE: Multidetector CT imaging of the chest was performed using the standard protocol during bolus administration of intravenous contrast. Multiplanar CT image reconstructions and MIPs were obtained to evaluate the vascular anatomy. Multidetector CT imaging of the abdomen and pelvis was performed using the standard protocol during bolus administration of intravenous contrast. CONTRAST:  132m OMNIPAQUE  IOHEXOL 350 MG/ML SOLN COMPARISON:  None. FINDINGS: CTA CHEST FINDINGS Cardiovascular: Contrast injection is sufficient to demonstrate satisfactory opacification of the pulmonary arteries to the segmental level.There are small segmental pulmonary emboli within the right upper, middle and lower lobes. On the left, there is a segmental embolus in the left upper lobe. The main pulmonary artery is enlarged, measuring 4.1 cm. There is no CT evidence of acute right heart strain. There is moderate aortic atherosclerosis. There is a normal 3-vessel arch branching pattern. Heart size is normal. No pericardial effusion. There are coronary artery calcifications. Mediastinum/Nodes: No mediastinal, hilar or axillary lymphadenopathy. The visualized thyroid and thoracic esophageal course are unremarkable. Lungs/Pleura: No pulmonary nodules or masses. No pleural effusion or pneumothorax. No focal airspace consolidation. No focal pleural abnormality. Musculoskeletal: No chest wall abnormality. No acute or significant osseous findings. Review of the MIP images confirms the above findings. CT ABDOMEN and PELVIS FINDINGS Hepatobiliary: Innumerable lesions throughout the liver. The largest lesion is in the right hepatic lobe and measures approximately 5.7 cm. Mild gallbladder wall thickening Pancreas: Pancreatic duct is dilated in the distal body and tail. The tail is atrophic. Spleen: Normal. Adrenals/Urinary Tract: --Adrenal glands: Normal. --Right kidney/ureter: No hydronephrosis or perinephric stranding. No nephrolithiasis. No obstructing ureteral stones. --Left kidney/ureter: No hydronephrosis or perinephric stranding. No nephrolithiasis. No obstructing ureteral stones. --Urinary bladder: Unremarkable. Stomach/Bowel: --Stomach/Duodenum: No hiatal hernia or other gastric abnormality. Normal duodenal course and caliber. --Small bowel: No dilatation or inflammation. --Colon: No focal abnormality. --Appendix: Not visualized. No right  lower quadrant inflammation or free fluid. Vascular/Lymphatic: Aortic atherosclerosis. Aorta bi-iliac stent is patent. 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 No abdominal or pelvic lymphadenopathy. Reproductive: Status post hysterectomy. No adnexal mass. Small volume free fluid in the pelvis. Musculoskeletal. No bony spinal canal stenosis or focal osseous abnormality. Other: None. IMPRESSION: 1. Small segmental pulmonary emboli within the right upper, middle and lower lobes and left upper lobe. No CT evidence of acute right heart strain. 2. Innumerable liver lesions, consistent with metastatic disease. 3. Dilated main pulmonary artery, consistent with pulmonary hypertension. 4. 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 Critical Value/emergent results were called by telephone at the time of interpretation on 05/31/2021 at 9:10 pm to provider DAVID YAO , who verbally acknowledged these results. Aortic Atherosclerosis (ICD10-I70.0). Electronically Signed   By: KUlyses JarredM.D.   On: 05/31/2021 21:11   CT Maxillofacial Wo Contrast  Result Date: 05/31/2021 CLINICAL DATA:  Mental status change, unknown cause; Neck trauma (Age >= 65y); Facial trauma. Increased confusion, slurred speech and multiple falls over the past week. EXAM: CT HEAD WITHOUT CONTRAST CT MAXILLOFACIAL WITHOUT CONTRAST CT CERVICAL SPINE WITHOUT CONTRAST TECHNIQUE: Multidetector CT imaging of the head, cervical spine, and maxillofacial structures were performed using the standard protocol without intravenous contrast. Multiplanar CT image reconstructions of the cervical spine and maxillofacial structures were also generated. COMPARISON:  None. FINDINGS: CT HEAD FINDINGS Brain: Normal anatomic configuration. Parenchymal volume loss is commensurate with the patient's age. Moderate  periventricular white matter changes are present likely reflecting the sequela of small vessel ischemia. Remote lacunar infarct versus dilated perivascular space within the right basal ganglia. No abnormal intra or extra-axial mass lesion or fluid collection. No abnormal mass effect or midline shift. No evidence of acute intracranial hemorrhage or infarct. Ventricular size is normal. Cerebellum unremarkable. Vascular: No asymmetric hyperdense vasculature at the skull base. Skull: Intact Other: Mastoid air cells and middle ear cavities are clear. CT MAXILLOFACIAL FINDINGS Osseous: Mild motion artifact noted. No acute facial fracture. No mandibular dislocation. Orbits: Negative. No traumatic or inflammatory finding. Sinuses: Clear. Soft tissues: Negative. CT CERVICAL SPINE FINDINGS Alignment: Normal.  No listhesis. Skull base and vertebrae: Craniocervical alignment is normal. The atlantodental interval is not widened. There is no acute fracture of the cervical spine. 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 No canal hematoma. The spinal canal is otherwise widely patent. No prevertebral soft tissue swelling. No pathologic adenopathy within the neck. No paraspinal fluid collections identified. Disc levels: There is mild intervertebral disc calcifications and posterior disc bulges noted at C3-C7 in keeping with changes of mild degenerative disc disease. Intervertebral disc height is preserved. Vertebral body height is preserved. The prevertebral soft tissues are not thickened on sagittal reformats. 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 Upper chest: Unremarkable Other: None IMPRESSION: No acute intracranial injury.  No calvarial fracture. No acute facial fracture. No acute fracture or  listhesis the cervical spine. Electronically Signed   By: Fidela Salisbury MD   On: 05/31/2021 21:03    Scheduled Meds:  diltiazem  180 mg Oral Daily   fluticasone furoate-vilanterol  1 puff Inhalation Daily   metoprolol succinate  25 mg Oral Daily   pantoprazole  40 mg Oral Q0600   rivastigmine  4.5 mg Oral BID   umeclidinium bromide  1 puff Inhalation Daily   Continuous Infusions:  aztreonam 1 g (06/02/21 0119)   metronidazole 500 mg (06/02/21 0858)   vancomycin Stopped (06/01/21 2115)     LOS: 2 days   Time spent: 35 minutes.  Patrecia Pour, MD [facility] [email or web address] 06/02/2021, 10:20 AM

## 2021-06-02 NOTE — Progress Notes (Signed)
SLP Cancellation Note  Patient Details Name: MAICEE ZEBLEY MRN: [id number] DOB: [birth year]   Cancelled eval: Confirmed with Dr. Bonner Puna that repeat swallow assessment is not indicated (pt evaluated yesterday). Will sign off.  Roneka Gilpin L. Tivis Ringer, [place] Office number [phone] Pager [phone]           Juan Quam Laurice 06/02/2021, 1:33 PM

## 2021-06-02 NOTE — Op Note (Signed)
[facility] Patient Name: Anne West Procedure Date : 06/02/2021 MRN: [id number] Attending MD: Jerene Bears , MD Date of Birth: May 08, 1946 CSN: [id number] Age: 75 Admit Type: Inpatient Procedure:                Upper GI endoscopy Indications:              Abnormal CT of the GI tract with concerning for                            metastatic malignancy of unknown primary                            (innumerable liver masses); intermittent dysphagia Providers:                Lajuan Lines. Hilarie Fredrickson, MD, Doristine Johns, RN, Ladona Ridgel, Technician Referring MD:             [facility] Medicines:                Monitored Anesthesia Care Complications:            No immediate complications. Estimated Blood Loss:     Estimated blood loss was minimal. Procedure:                Pre-Anesthesia Assessment:                           - Prior to the procedure, a History and Physical                            was performed, and patient medications and                            allergies were reviewed. The patient's tolerance of                            previous anesthesia was also reviewed. The risks                            and benefits of the procedure and the sedation                            options and risks were discussed with the patient.                            All questions were answered, and informed consent                            was obtained. Prior Anticoagulants: The patient has                            taken no previous anticoagulant or antiplatelet  agents. ASA Grade Assessment: III - A patient with                            severe systemic disease. After reviewing the risks                            and benefits, the patient was deemed in                            satisfactory condition to undergo the procedure.                           After obtaining informed consent, the endoscope  was                            passed under direct vision. Throughout the                            procedure, the patient's blood pressure, pulse, and                            oxygen saturations were monitored continuously. The                            [id number]) Olympus endoscope was introduced                            through the mouth, and advanced to the third part                            of duodenum. The upper GI endoscopy was                            accomplished without difficulty. The patient                            tolerated the procedure well. Scope In: Scope Out: Findings:      The examined esophagus was normal. No esophagitis or stricture.      Scattered mild inflammation characterized by erosions and erythema was       found in the gastric body and in the gastric antrum. Biopsies were taken       with a cold forceps for histology and Helicobacter pylori testing.      Mild inflammation characterized by erosions and erythema was found in       the duodenal bulb.      The exam of the duodenum was otherwise normal. Impression:               - Normal esophagus.                           - Gastroduodenitis without bleeding. Biopsied.                           - No upper GI source of malignancy. Moderate Sedation:  N/A Recommendation:           - Return patient to hospital ward for ongoing care.                           - Advance diet as tolerated and liver biopsy.                           - Continue present medications. Resume heparin when                            cleared by IR after liver biopsy.                           - Await pathology results.                           - Current plan is for IR guided liver biopsy of                            liver tumor. At present no imminent plans for                            colonoscopy based on patient's overall current                            condition, no tumor seen on multiple CTs, and plan                             for biopsy of liver mass. Anticipate oncology                            consult once diagnosis confirmed.                           - GI will sign off, but remain available for                            questions. Procedure Code(s):        --- Professional ---                           [phone], Esophagogastroduodenoscopy, flexible,                            transoral; with biopsy, single or multiple Diagnosis Code(s):        --- Professional ---                           K29.70, Gastritis, unspecified, without bleeding                           K29.80, Duodenitis without bleeding                           R13.10, Dysphagia, unspecified  R93.3, Abnormal findings on diagnostic imaging of                            other parts of digestive tract CPT copyright 2019 [employer]. All rights reserved. The codes documented in this report are preliminary and upon coder review may  be revised to meet current compliance requirements. Jerene Bears, MD 06/02/2021 7:58:21 AM This report has been signed electronically. Number of Addenda: 0

## 2021-06-02 NOTE — Progress Notes (Signed)
ANTICOAGULATION CONSULT NOTE  Pharmacy Consult for Heparin Indication: pulmonary embolus  Allergies  Allergen Reactions   Donepezil Itching    SEVERE ITCHING   Penicillins Cross Reactors     Unknown    Sulfa Antibiotics     Patient Measurements: Height: '5\' 7"'$  (170.2 cm) Weight: 59 kg (130 lb) IBW/kg (Calculated) : 61.6 Heparin Dosing Weight: 59 kg  Vital Signs: Temp: 98.8 F (37.1 C) (08/05 0845) Temp Source: Oral (08/05 0845) BP: 143/62 (08/05 1213) Pulse Rate: 64 (08/05 1213)  Labs: Recent Labs    05/31/21 1711 05/31/21 1803 06/01/21 0556 06/01/21 0653 06/01/21 1605 06/02/21 0233  HGB 10.4*  --  9.5*  --   --  9.3*  HCT 31.2*  --  30.4*  --   --  27.9*  PLT 103*  --  112*  --   --  147*  APTT 38*  --   --   --   --   --   LABPROT 18.0*  --   --   --   --   --   INR 1.5*  --   --   --   --   --   HEPARINUNFRC  --   --   --  0.15* 0.26*  --   CREATININE 0.71  --  0.53  --   --   --   CKTOTAL  --  81  --   --   --   --      Estimated Creatinine Clearance: 54.9 mL/min (by C-G formula based on SCr of 0.53 mg/dL).   Medical History: Past Medical History:  Diagnosis Date   A-fib (HCC)    COPD (chronic obstructive pulmonary disease) (HCC)    High cholesterol    Hypertension     Medications:  Scheduled:   atorvastatin  80 mg Oral Daily   diltiazem  180 mg Oral Daily   fluticasone furoate-vilanterol  1 puff Inhalation Daily   gelatin adsorbable       lidocaine       metoprolol succinate  25 mg Oral Daily   pantoprazole  40 mg Oral Q0600   rivastigmine  4.5 mg Oral BID   umeclidinium bromide  1 puff Inhalation Daily    Assessment: 75 YO female who presented with confusion, lower abdominal pain, multiple falls in the last week, slurred speech, and having issues swallowing so not eating or drinking. On chest CT, found small segmental pulmonary emboli within the right upper, middle and lower lobes and upper left lobe.  She is now s/p endoscopy with no  intervention and just had a liver biopsy. Heparin to restart 2 hours post procedure.    Goal of Therapy:  Heparin level 0.3-0.7 units/ml Monitor platelets by anticoagulation protocol: Yes   Plan:  -No heparin bolus -Restart heparin 1300 units/hr at 3:30pm -Heparin level in 8 hours and daily wth CBC daily  Hildred Laser, PharmD Clinical Pharmacist **Pharmacist phone directory can now be found on [email or web address] ([email or web address]).  Listed under [place].    **Pharmacist phone directory can now be found on [email or web address] ([email or web address]).  Listed under [place].

## 2021-06-02 NOTE — Interval H&P Note (Signed)
History and Physical Interval Note: For egd this am to rule out UGI etiology of probably metastatic malignancy as seen by CT scan (innumerable liver masses).  Also report of dysphagia, though patient reports today swallowing is "ok". Discussed with pt and her family. The nature of the procedure, as well as the risks, benefits, and alternatives were carefully and thoroughly reviewed with the patient. Ample time for discussion and questions allowed. The patient understood, was satisfied, and agreed to proceed.   06/02/2021 7:33 AM  Anne West  has presented today for surgery, with the diagnosis of Dysphagia.  Iron deficiency anemia.  FOBT positive stools.  Metastatic liver lesions.  Anorexia..  The various methods of treatment have been discussed with the patient and family. After consideration of risks, benefits and other options for treatment, the patient has consented to  Procedure(s): ESOPHAGOGASTRODUODENOSCOPY (EGD) WITH PROPOFOL possible Dilation (N/A) as a surgical intervention.  The patient's history has been reviewed, patient examined, no change in status, stable for surgery.  I have reviewed the patient's chart and labs.  Questions were answered to the patient's satisfaction.     Lajuan Lines Awad Gladd

## 2021-06-02 NOTE — Consult Note (Addendum)
Requesting Physician: Dr. Bonner Puna    Chief Complaint: Bilateral MCA Stroke   History obtained from:  Chart Review and patients daughter   HPI:                                                                                                                                         Anne West is an 75 y.o. female w/pmh of AFib not on anticoagulation, COPD, HTN, HLD, cognitive impairment, AAA s/p repair, and recent hospitalization for abdominal pain which via work up she was found to have numerous liver lesions and thickened gallbladder without other evidence of cholecystitis.   Upon returning home she was not eating well, grew more confused and had several falls. Due to this and slurred speech her daughter brought her back to the ER. There, she was noted to be febrile at 100.53F, tachycardic (sinus), without hypotension or hypoxia. WBC 14.4k, hgb 10.4g/dl, platelets 103k. SCr 0.7. AST 133, ALT 145, alk phos 814, TBili 2.5. Covid, flue PCR negative. CT Abdomen Pelvis was obtained that was pertinent for small segmental pulmonary emboli and innumerable liver lesions consistent with metastatic disease.  As a part of work up for falls, increasing confusion and slurred speech MRI Brain was ordered this admission. This revealed bilateral MCA stroke for which neurology was consulted.   She was not a candidate for IVTPA or thrombectomy given she was outside of the time window for both.   Past Medical History:  Diagnosis Date   A-fib [facility])    COPD (chronic obstructive pulmonary disease) (HCC)    High cholesterol    Hypertension     Past Surgical History:  Procedure Laterality Date   ABDOMINAL HYSTERECTOMY     BACK SURGERY      History reviewed. No pertinent family history.     Social History:  reports that she has been smoking cigarettes. She has never used smokeless tobacco. She reports that she does not drink alcohol and does not use drugs.  Allergies:  Allergies  Allergen Reactions    Donepezil Itching    SEVERE ITCHING   Penicillins Cross Reactors     Unknown    Sulfa Antibiotics     Medications:  I have reviewed the patient's current medications.  ROS:                                                                                                                                       History obtained from the patient  General ROS: negative for - chills, fatigue, fever, night sweats, weight gain or weight loss Psychological ROS: negative for - , hallucinations, memory difficulties, mood swings or  Ophthalmic ROS: negative for - blurry vision, double vision, eye pain or loss of vision ENT ROS: negative for - epistaxis, nasal discharge, oral lesions, sore throat, tinnitus or vertigo Respiratory ROS: negative for - cough,  shortness of breath or wheezing Cardiovascular ROS: negative for - chest pain, dyspnea on exertion,  Gastrointestinal ROS: negative for - abdominal pain, diarrhea,  nausea/vomiting or stool incontinence Genito-Urinary ROS: negative for - dysuria, hematuria, incontinence or urinary frequency/urgency Musculoskeletal ROS: negative for - joint swelling or muscular weakness Neurological ROS: as noted in HPI   General Examination:                                                                                                      Constitutional: Frail African American Woman resting in bed  Respiratory: Unlabored respirations  Cardiac: RRR  Neurological Examination MS: Oriented to self and month. States that she is in a nursing home. Follows commands to show two fingers, give thumbs up, open/close eyes and stick out tongue  Speech: Fluent with repetition and naming intact  CN: EOMI, VFF, Face symmetric  Motor: Decreased bulk, normal tone. No drift. RUE 5/5 LUE 5/5 RLE  Reflexes: 2+ symmetric throughout  Sensation: Intact to light  touch  Coordination: Intact FNF  Gait: Deferred    Pertinent Imaging: 05/31/21 CT Head NAICP   06/01/21 MRI Brain  1. Multifocal bilateral acute or early subacute ischemia within the MCA territories. No hemorrhage or mass effect. 2. Punctate focus of acute ischemia in the cerebellar vermis. 3. Atrophy and findings of chronic microvascular ischemia.  06/02/21 Echo  Results pending   Assessment and Plan:  Anne West is a 75 year old female w/pmh of AFib not on anticoagulation, COPD, HTN, HLD, cognitive impairment, AAA s/p repair, and recent hospitalization for abdominal pain who presents with confusion, falls and slurred speech. Via work up for confusion MRI Brain revealed bilateral MCA Stroke + a small punctate stroke to the cerebellar vermis.   Her neurological examination is pertinent for confusion and slight difficulty with following  commands- needed some coaching.   Stroke work up is ongoing- pending MRA Head, CUS and Echo report. Stroke labs have been ordered.  In regards to her stroke etiology it is cryptogenic however leading causes include a hypercoagulable state due to malignancy and atrial fibrillation not on anticoagulation.   Fortunately her strokes are small and heparin can be continued at the discretion of primary team for stroke treatment ( held in the setting of liver biopsy).   Recommendations:  - Follow up remaining stroke work up; MRA Head, CUS, stroke labs, echocardiogram read - PT/OT/ST  - 80 mg of Atorvastatin  - Resume IV Heparin at primary teams discretion  - Telemetry monitoring - Neuro checks Q4   Anne West  [facility] Patient discussed with attending physician Dr. Lorrin Goodell  06/02/2021, 11:15 AM

## 2021-06-02 NOTE — Progress Notes (Addendum)
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  GOAL:   Patient will meet greater than or equal to 90% of their needs  MONITOR:   PO intake, Supplement acceptance  REASON FOR ASSESSMENT:   Consult Assessment of nutrition requirement/status, Poor PO  ASSESSMENT:   74 yo female admitted with AMS, slurred speech, abdominal pain, multiple falls, poor oral intake with difficulty swallowing. Found to have acute pulmonary embolism and scattered bilateral MCA strokes. PMH includes A fib, COPD, HTN, HLD. 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   EGD 8/5 showed non-bleeding gastroduodenitis, no evidence of malignancy. S/P biopsy of liver lesion by IR 8/5.   S/P bedside swallow evaluation with SLP on 8/4. No S/S of dysphagia or aspiration noticed.    Spoke with patient's daughter at bedside. She reports that patient has been eating poorly for a few months now due to abdominal pain. She used to drink supplements well, but lately, she has c/o that everything she puts in her mouth is nasty. She got her to drink part of an Ensure supplement yesterday evening. She only ate a few bites of lunch today. Daughter thinks patient may like orange cream magic cups, will add to meal trays. Will also have RN offer Ensure Enlive/Plus TID between meals to maximize oral intake.   Labs reviewed. Na 134, K 3.3  Medications reviewed and include protonix, flagyl, vancomycin.  No recent usual weights  available for review, but daughter endorses a lot of weight loss.   Patient meets criteria for severe malnutrition with severe depletion of muscle and subcutaneous fat mass.  NUTRITION - FOCUSED PHYSICAL EXAM:  Flowsheet Row Most Recent Value  Orbital Region Mild depletion  Upper Arm Region Severe depletion  Thoracic and Lumbar Region Severe depletion  Buccal Region Severe depletion  Temple Region Moderate depletion  Clavicle Bone Region Severe depletion  Clavicle and Acromion Bone Region Severe depletion  Scapular Bone Region Severe depletion  Dorsal Hand Severe depletion  Patellar Region Moderate depletion  Anterior Thigh Region Moderate depletion  Posterior Calf Region Moderate depletion  Edema (RD Assessment) None  Hair Reviewed  Eyes Reviewed  Mouth Reviewed  Skin Reviewed  Nails Reviewed       Diet Order:   Diet Order             DIET SOFT Room service appropriate? Yes; Fluid consistency: Thin  Diet effective now                   EDUCATION NEEDS:   Not appropriate for education at this time  Skin:  Skin Assessment: Reviewed RN Assessment  Last BM:  8/4  Height:   Ht Readings from Last 1 Encounters:  05/31/21 '5\' 7"'$  (1.702 m)    Weight:   Wt Readings from Last 1 Encounters:  05/31/21 59 kg    Ideal Body Weight:  61.4 kg  BMI:  Body mass index is 20.36 kg/m.  Estimated Nutritional Needs:   Kcal:  1800-2000  Protein:  85-100 gm  Fluid:  >/=  1.8 L    Lucas Mallow, RD, LDN, CNSC Please refer to [facility] for contact information.

## 2021-06-02 NOTE — Evaluation (Signed)
Occupational Therapy Evaluation Patient Details Name: Anne West MRN: [id number] DOB: 1946-08-04 Today's Date: 06/02/2021    History of Present Illness 75 y.o. female presents to [facility] ED with reports of confusion, slurred speech, abdominal pain, falls, and dysphagia. Pt recently diagnosed with liver mets with unknown primary malignancy. Pt found to have acute subsegmental PE. MRI 8/4 shows Multifocal bilateral acute or early subacute ischemia within the  MCA territories. Pt underwent upper GI endoscopy on 8/5. PMH includes dementia, AAA repair, A. fib not on anticoagulation, COPD, hypertension, hyperlipidemia, dementia.   Clinical Impression   Pt admitted for concerns listed above. PTA pt and daughter reported that she overall independent with a SPC, only needing supervision, except with bathing and stairs, which require min A. At this time, pt is weak and experiencing increased pain, requiring min g-min A for most ADL's and functional mobility. Pt refusing ambulation this session due to fatigue and pain. OT will follow up acutely to further assess mobility and address deficits listed below.    Follow Up Recommendations  Home health OT;Supervision/Assistance - 24 hour    Equipment Recommendations  Tub/shower bench    Recommendations for Other Services       Precautions / Restrictions Precautions Precautions: Fall Restrictions Weight Bearing Restrictions: No      Mobility Bed Mobility Overal bed mobility: Needs Assistance Bed Mobility: Supine to Sit;Sit to Supine     Supine to sit: Min assist Sit to supine: Min assist        Transfers Overall transfer level: Needs assistance Equipment used: 1 person hand held assist;Rolling walker (2 wheeled) Transfers: Sit to/from Stand Sit to Stand: Min assist Stand pivot transfers: Min assist       General transfer comment: Limited by pain    Balance Overall balance assessment: Needs assistance Sitting-balance support: No  upper extremity supported;Feet supported Sitting balance-Leahy Scale: Fair     Standing balance support: Single extremity supported;Bilateral upper extremity supported Standing balance-Leahy Scale: Poor Standing balance comment: reliant on UE support                           ADL either performed or assessed with clinical judgement   ADL Overall ADL's : Needs assistance/impaired Eating/Feeding: Supervision/ safety;Sitting   Grooming: Set up;Sitting   Upper Body Bathing: Supervision/ safety;Sitting   Lower Body Bathing: Minimal assistance;Sitting/lateral leans;Sit to/from stand   Upper Body Dressing : Supervision/safety;Sitting   Lower Body Dressing: Minimal assistance;Sitting/lateral leans;Sit to/from stand   Toilet Transfer: Minimal assistance;Ambulation   Toileting- Clothing Manipulation and Hygiene: Minimal assistance;Sit to/from stand;Sitting/lateral lean   Tub/ Shower Transfer: Minimal assistance;Ambulation   Functional mobility during ADLs: Minimal assistance General ADL Comments: Pt very limited by pain this session, mild aggrivation with OT asking her to get up and move     Vision Patient Visual Report: No change from baseline Vision Assessment?: No apparent visual deficits     Perception Perception Perception Tested?: No   Praxis Praxis Praxis tested?: Not tested    Pertinent Vitals/Pain Pain Assessment: Faces Faces Pain Scale: Hurts whole lot Pain Location: back Pain Descriptors / Indicators: Grimacing;Moaning Pain Intervention(s): Limited activity within patient's tolerance;Monitored during session;Repositioned     Hand Dominance Right   Extremity/Trunk Assessment Upper Extremity Assessment Upper Extremity Assessment: Generalized weakness   Lower Extremity Assessment Lower Extremity Assessment: Generalized weakness   Cervical / Trunk Assessment Cervical / Trunk Assessment: Kyphotic   Communication Communication Communication: No  difficulties  Cognition Arousal/Alertness: Awake/alert Behavior During Therapy: Agitated;Flat affect Overall Cognitive Status: History of cognitive impairments - at baseline Area of Impairment: Attention;Following commands;Safety/judgement;Awareness                   Current Attention Level: Sustained   Following Commands: Follows one step commands inconsistently;Follows one step commands with increased time (follows one-step commands with increased time initially, with fatigue command following becomes less consistent) Safety/Judgement: Decreased awareness of safety;Decreased awareness of deficits Awareness: Intellectual   General Comments: Pt has h/o dementoa   General Comments  VSS    Exercises     Shoulder Instructions      Home Living Family/patient expects to be discharged to:: Private residence Living Arrangements: Children Available Help at Discharge: Family;Available 24 hours/day Type of Home: House Home Access: Stairs to enter [employer] of Steps: 4 Entrance Stairs-Rails: Right;Left Home Layout: One level     Bathroom Shower/Tub: Teacher, early years/pre: Handicapped height Bathroom Accessibility: Yes How Accessible: Accessible via walker Home Equipment: [place] - 2 wheels;Walker - 4 wheels;Cane - single point          Prior Functioning/Environment Level of Independence: Needs assistance  Gait / Transfers Assistance Needed: pt ambulates with use of cane, transfers independently, requires assistance for stair negotiation ADL's / Homemaking Assistance Needed: Pr requires assist for transfers into the tub and supervision with bathing            OT Problem List: Decreased strength;Decreased range of motion;Impaired balance (sitting and/or standing);Decreased activity tolerance;Decreased coordination;Decreased safety awareness;Decreased knowledge of use of DME or AE;Pain      OT Treatment/Interventions: Self-care/ADL  training;Therapeutic exercise;Energy conservation;DME and/or AE instruction;Therapeutic activities;Cognitive remediation/compensation;Patient/family education;Balance training    OT Goals(Current goals can be found in the care plan section) Acute Rehab OT Goals Patient Stated Goal: to improve mobility and return home OT Goal Formulation: With patient/family Time For Goal Achievement: 06/16/21 Potential to Achieve Goals: Good ADL Goals Pt Will Perform Grooming: with modified independence;standing Pt Will Perform Lower Body Bathing: with modified independence;sit to/from stand;sitting/lateral leans Pt Will Perform Lower Body Dressing: with modified independence;sitting/lateral leans;sit to/from stand Pt Will Transfer to Toilet: with modified independence;ambulating Pt Will Perform Toileting - Clothing Manipulation and hygiene: with modified independence;sitting/lateral leans;sit to/from stand  OT Frequency: Min 2X/week   Barriers to D/C:            Co-evaluation              AM-PAC OT "6 Clicks" Daily Activity     Outcome Measure Help from another person eating meals?: A Little Help from another person taking care of personal grooming?: A Little Help from another person toileting, which includes using toliet, bedpan, or urinal?: A Little Help from another person bathing (including washing, rinsing, drying)?: A Little Help from another person to put on and taking off regular upper body clothing?: A Little Help from another person to put on and taking off regular lower body clothing?: A Little 6 Click Score: 18   End of Session Equipment Utilized During Treatment: Gait belt Nurse Communication: Mobility status  Activity Tolerance: Patient limited by fatigue;Patient limited by pain Patient left: in bed;with bed alarm set;with call bell/phone within reach;with family/visitor present  OT Visit Diagnosis: Unsteadiness on feet (R26.81);Other abnormalities of gait and mobility  (R26.89);Muscle weakness (generalized) (M62.81)                Time: [id number] OT Time Calculation (min): 21 min Charges:  OT General Charges $OT Visit: 1 Visit OT Evaluation $OT Eval Moderate Complexity: 1 Mod  [facility]ita Currin H., OTR/L Acute Rehabilitation  Kyaire Gruenewald Elane Yolanda Bonine 06/02/2021, 6:06 PM

## 2021-06-02 NOTE — Procedures (Addendum)
I spoke to the patient's daughter Joelene Millin by phone after upper endoscopy We reviewed the findings and current plan for the patient going forward with radiology assisted liver biopsy Time provided for questions and answers and the patient's daughter thanked me for the call

## 2021-06-02 NOTE — Progress Notes (Signed)
Pharmacy Antibiotic Note  Anne West is a 75 y.o. female admitted on 05/31/2021 with sepsis.  Pharmacy has been consulted for vancomycin and aztreonam dosing (day 2 of therapy) -WBC= 11.8, afebrile, SCr= 0.53 -blood cultures- ngtd -urine cultures- multiple organisms  Plan: -Vancomycin '750mg'$  IV q24h (eAUC 516, Cr rounded up to 0.'8mg'$ /dL) Aztreonam  1g IV q8h -Monitor renal function, clinical status, and antibiotic plan -Check vancomycin level based on length of therapy   Height: '5\' 7"'$  (170.2 cm) Weight: 59 kg (130 lb) IBW/kg (Calculated) : 61.6  Temp (24hrs), Avg:98.3 F (36.8 C), Min:97.1 F (36.2 C), Max:99.2 F (37.3 C)  Recent Labs  Lab 05/31/21 1711 06/01/21 0556 06/02/21 0233  WBC 14.4* 13.6* 11.8*  CREATININE 0.71 0.53  --   LATICACIDVEN 1.5  --   --      Estimated Creatinine Clearance: 54.9 mL/min (by C-G formula based on SCr of 0.53 mg/dL).    Allergies  Allergen Reactions   Donepezil Itching    SEVERE ITCHING   Penicillins Cross Reactors     Unknown    Sulfa Antibiotics     Antimicrobials this admission: 8/3 Vanc >>  8/3 Aztreonam >>  Flagyl q8h dosing per MD  Dose adjustments this admission: N/A  Microbiology results: 8/3 BCx: ngtd 8/3 UCx:  multiple  Thank you for allowing pharmacy to be a part of this patient's care.  Hildred Laser, PharmD Clinical Pharmacist **Pharmacist phone directory can now be found on [email or web address] ([email or web address]).  Listed under [place].

## 2021-06-02 NOTE — Procedures (Signed)
Interventional Radiology Procedure Note  Procedure: US guided biopsy of liver mass, right liver.  Mx [id number] core Complications: None EBL: None Recommendations: - Bedrest at least 2 hours.   - Routine wound care - Follow up pathology - Advance diet per primary order - OK for restart heparin ggt in 2 hours  Signed,  Corrie Mckusick, DO

## 2021-06-02 NOTE — Progress Notes (Signed)
Carotid duplex bilateral study completed.   Please see CV Proc for preliminary results.   Cloyd Ragas, RDMS, RVT  

## 2021-06-02 NOTE — Anesthesia Procedure Notes (Signed)
Procedure Name: MAC Date/Time: 06/02/2021 7:35 AM Performed by: Inda Coke, CRNA Pre-anesthesia Checklist: Patient identified, Emergency Drugs available, Suction available, Timeout performed and Patient being monitored Patient Re-evaluated:Patient Re-evaluated prior to induction Oxygen Delivery Method: Nasal cannula Induction Type: IV induction Dental Injury: Teeth and Oropharynx as per pre-operative assessment

## 2021-06-02 NOTE — Transfer of Care (Signed)
Immediate Anesthesia Transfer of Care Note  Patient: Anne West  Procedure(s) Performed: ESOPHAGOGASTRODUODENOSCOPY (EGD) WITH PROPOFOL possible Dilation BIOPSY  Patient Location: Endoscopy Unit  Anesthesia Type:MAC  Level of Consciousness: awake and drowsy  Airway & Oxygen Therapy: Patient Spontanous Breathing and Patient connected to nasal cannula oxygen  Post-op Assessment: Report given to RN and Post -op Vital signs reviewed and stable  Post vital signs: Reviewed and stable  Last Vitals:  Vitals Value Taken Time  BP 111/29 06/02/21 0755  Temp    Pulse 66 06/02/21 0756  Resp 22 06/02/21 0756  SpO2 100 % 06/02/21 0756  Vitals shown include unvalidated device data.  Last Pain:  Vitals:   06/02/21 0701  TempSrc: Temporal  PainSc: 0-No pain         Complications: No notable events documented.

## 2021-06-03 DIAGNOSIS — G9341 Metabolic encephalopathy: Secondary | ICD-10-CM

## 2021-06-03 DIAGNOSIS — E43 Unspecified severe protein-calorie malnutrition: Secondary | ICD-10-CM

## 2021-06-03 DIAGNOSIS — R935 Abnormal findings on diagnostic imaging of other abdominal regions, including retroperitoneum: Secondary | ICD-10-CM

## 2021-06-03 DIAGNOSIS — I6340 Cerebral infarction due to embolism of unspecified cerebral artery: Secondary | ICD-10-CM | POA: Insufficient documentation

## 2021-06-03 DIAGNOSIS — D530 Protein deficiency anemia: Secondary | ICD-10-CM

## 2021-06-03 LAB — BASIC METABOLIC PANEL
Anion gap: 10 (ref 5–15)
Anion gap: 14 (ref 5–15)
BUN: 11 mg/dL (ref 8–23)
BUN: 10 mg/dL (ref 8–23)
CO2: 24 mmol/L (ref 22–32)
CO2: 22 mmol/L (ref 22–32)
Calcium: 7.8 mg/dL — ABNORMAL LOW (ref 8.9–10.3)
Calcium: 8 mg/dL — ABNORMAL LOW (ref 8.9–10.3)
Chloride: 101 mmol/L (ref 98–111)
Chloride: 98 mmol/L (ref 98–111)
Creatinine, Ser: 0.52 mg/dL (ref 0.44–1.00)
Creatinine, Ser: 0.51 mg/dL (ref 0.44–1.00)
GFR, Estimated: >60 mL/min (ref >60)
GFR, Estimated: >60 mL/min (ref >60)
Glucose, Bld: 97 mg/dL (ref 70–99)
Glucose, Bld: 90 mg/dL (ref 70–99)
Potassium: 2.8 mmol/L — ABNORMAL LOW (ref 3.5–5.1)
Potassium: 3.4 mmol/L — ABNORMAL LOW (ref 3.5–5.1)
Sodium: 135 mmol/L (ref 135–145)
Sodium: 134 mmol/L — ABNORMAL LOW (ref 135–145)

## 2021-06-03 LAB — HEPARIN LEVEL (UNFRACTIONATED)
Heparin Unfractionated: 0.37 IU/mL (ref 0.30–0.70)
Heparin Unfractionated: 0.42 IU/mL (ref 0.30–0.70)

## 2021-06-03 LAB — CBC
HCT: 24.2 % — ABNORMAL LOW (ref 36.0–46.0)
Hemoglobin: 8.1 g/dL — ABNORMAL LOW (ref 12.0–15.0)
MCH: 28.4 pg (ref 26.0–34.0)
MCHC: 33.5 g/dL (ref 30.0–36.0)
MCV: 84.9 fL (ref 80.0–100.0)
Platelets: 131 10*3/uL — ABNORMAL LOW (ref 150–400)
RBC: 2.85 MIL/uL — ABNORMAL LOW (ref 3.87–5.11)
RDW: 17.2 % — ABNORMAL HIGH (ref 11.5–15.5)
WBC: 16.3 10*3/uL — ABNORMAL HIGH (ref 4.0–10.5)
nRBC: 0 % (ref 0.0–0.2)

## 2021-06-03 LAB — LIPID PANEL
Cholesterol: 93 mg/dL (ref 0–200)
HDL: 15 mg/dL — ABNORMAL LOW (ref >40)
LDL Cholesterol: 61 mg/dL (ref 0–99)
Total CHOL/HDL Ratio: 6.2 RATIO
Triglycerides: 85 mg/dL (ref <150)
VLDL: 17 mg/dL (ref 0–40)

## 2021-06-03 LAB — HEMOGLOBIN A1C
Hgb A1c MFr Bld: 5.1 % (ref 4.8–5.6)
Mean Plasma Glucose: 99.67 mg/dL

## 2021-06-03 MED ORDER — ADULT MULTIVITAMIN LIQUID CH
15.0000 mL | Freq: Every day | ORAL | Status: DC
  Administered 2021-06-04 – 2021-06-06 (×3): 15 mL via ORAL
  Filled 2021-06-03 (×4): qty 15

## 2021-06-03 MED ORDER — MORPHINE SULFATE (PF) 4 MG/ML IV SOLN
4.0000 mg | Freq: Once | INTRAVENOUS | Status: AC
  Administered 2021-06-03: 4 mg via INTRAVENOUS
  Filled 2021-06-03: qty 1

## 2021-06-03 MED ORDER — POTASSIUM CHLORIDE CRYS ER 20 MEQ PO TBCR
40.0000 meq | EXTENDED_RELEASE_TABLET | ORAL | Status: DC
  Administered 2021-06-03: 40 meq via ORAL
  Filled 2021-06-03: qty 2

## 2021-06-03 MED ORDER — POTASSIUM CHLORIDE 20 MEQ PO PACK
40.0000 meq | PACK | Freq: Once | ORAL | Status: AC
  Administered 2021-06-03: 40 meq via ORAL
  Filled 2021-06-03: qty 2

## 2021-06-03 MED ORDER — RIVASTIGMINE 9.5 MG/24HR TD PT24
9.5000 mg | MEDICATED_PATCH | TRANSDERMAL | Status: DC
  Administered 2021-06-03 – 2021-06-05 (×3): 9.5 mg via TRANSDERMAL
  Filled 2021-06-03 (×5): qty 1

## 2021-06-03 MED ORDER — MORPHINE SULFATE (PF) 2 MG/ML IV SOLN
1.0000 mg | INTRAVENOUS | Status: DC | PRN
  Administered 2021-06-04: 1 mg via INTRAVENOUS
  Filled 2021-06-03: qty 1

## 2021-06-03 MED ORDER — IBUPROFEN 100 MG/5ML PO SUSP
200.0000 mg | Freq: Four times a day (QID) | ORAL | Status: DC | PRN
  Administered 2021-06-03 – 2021-06-05 (×7): 200 mg via ORAL
  Filled 2021-06-03 (×10): qty 10

## 2021-06-03 MED ORDER — POTASSIUM CHLORIDE 10 MEQ/100ML IV SOLN
10.0000 meq | INTRAVENOUS | Status: DC
Start: 2021-06-03 — End: 2021-06-03

## 2021-06-03 MED ORDER — ATORVASTATIN CALCIUM 40 MG PO TABS
40.0000 mg | ORAL_TABLET | Freq: Every day | ORAL | Status: DC
  Administered 2021-06-04 – 2021-06-06 (×3): 40 mg via ORAL
  Filled 2021-06-03 (×3): qty 1

## 2021-06-03 NOTE — Progress Notes (Signed)
Pt is restless and moaning for pain. Oxycodone was given earlier but did not help. MD on call paged and ordered one time dose of Morphine IV 4 mg. Will monitor pt.

## 2021-06-03 NOTE — Progress Notes (Signed)
ANTICOAGULATION CONSULT NOTE - Follow Up Consult  Pharmacy Consult for heparin Indication: pulmonary embolus  Labs: Recent Labs    05/31/21 1711 05/31/21 1803 06/01/21 0556 06/01/21 0653 06/02/21 0233 06/02/21 2242 06/03/21 0014 06/03/21 0724 06/03/21 0733 06/03/21 1541  HGB 10.4*  --  9.5*  --  9.3*  --  8.1*  --   --   --   HCT 31.2*  --  30.4*  --  27.9*  --  24.2*  --   --   --   PLT 103*  --  112*  --  147*  --  131*  --   --   --   APTT 38*  --   --   --   --   --   --   --   --   --   LABPROT 18.0*  --   --   --   --   --   --   --   --   --   INR 1.5*  --   --   --   --   --   --   --   --   --   HEPARINUNFRC  --   --   --    < >  --  <0.10*  --  0.37  --  0.42  CREATININE 0.71  --  0.53  --   --   --   --   --  0.52 0.51  CKTOTAL  --  81  --   --   --   --   --   --   --   --    < > = values in this interval not displayed.     Assessment: Anne West is a 72 YOF who presented with confusion, lower abdomina pain, multiple falls in the last week, slurred speech and having issues swallowing (no eating or drinking). On chest CT, found small segmental pulmonary emboli within the right upper, middle and lower lobes and upper left lobe. She ios s/p endoscopy with no intervention and had liver biopsy on 8/05. Pharmacy consulted to dose heparin.   Heparin level remains therapeutic at 0.42 on 1400 units/hr of IV heparin. No s/s of overt bleeding noted per RN   Goal of Therapy:  Heparin level 0.3-0.7 units/ml   Plan:  Continue IV heparin gtt at 1400 units/h Daily heparin level, CBC Monitor s/sx of bleeding F/u PO anticoagulation plan  Thank you for involving pharmacy in this patient's care.  Anne West, PharmD., BCPS, BCCCP Clinical Pharmacist Please refer to [facility] for unit-specific pharmacist

## 2021-06-03 NOTE — Progress Notes (Signed)
PROGRESS NOTE  Anne West  ZOX:[id number] DOB: 13-Nov-1945 DOA: 05/31/2021 PCP: Default, Provider, MD   Brief Narrative: Anne West is a 75 y.o. female with a history of AFib not on anticoagulation, COPD, HTN, HLD, cognitive impairment, AAA s/p repair, and recent hospitalization for abdominal pain thought to be due to constipation where she was found to have numerous liver lesions and thickened gallbladder without other evidence of cholecystitis. She returned home and has continued to eat poorly, grow more confused than baseline and have several falls. Due to this and some slurred speech, her daughter brought her to the ED where she was febrile at 100.61F, tachycardic (sinus), without hypotension or hypoxia. WBC 14.4k, hgb 10.4g/dl, platelets 103k. SCr 0.7. AST 133, ALT 145, alk phos 814, TBili 2.5. Covid, flue PCR negative. Vast imaging evaluation revealed hepatic lesions, stable gallbladder wall thickening without cholelithiasis or pericholecystic fluid to suggest cholecystitis, and small bilateral pulmonary emboli.   Blood cultures were drawn, broad IV antibiotics started for SIRS (NOS), IV heparin was started for PE. GI was consulted for dysphagia, performed EGD 06/02/2021 showing non-bleeding gastroduodenitis, which was biopsied, without evidence of malignancy (full report below). IR is consulted and will perform U/S-guided biopsy of liver lesion. Oncology has made recommendations to complete initial work up and will follow up with the patient after pathology results have returned.  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 - Neurology consulted -  Echo pending - HDL low - lipid panel otherwise unremarkable; A1C WNL - Continue cardiac monitoring, though pt has known atrial fibrillation.  - PT, OT, SLP ordered -Continue increased statin, IV heparin  SIRS: WITHOUT source - Tmax is 100.72F - fever in the setting of cancer/blood clot is not uncommon - Continue empiric abx, especially as we consider biopsy and await culture result - procalcitonin elevated at 1.5  - Continue to follow for source - Urine culture appears poor collection; Blood cultures negative thus far  Acute pulmonary emboli: Provoked by malignancy. Not hemodynamically compromising or causing hypoxia.  - Continue IV heparin which can be paused if needed for biopsy. - Check echocardiogram  Metastatic cancer, unknown primary: Numerous hepatic lesions on CT.  - IR biopsy planned 8/5.  - CA-125 4,263 and CEA 1,488 grossly elevated. - Oncology, Dr. Chryl Heck, consulted and discussed with the family by phone the plan for expedited oncology clinic follow up once biopsy results are back.  Falls, increasing confusion, slurred speech:  - Likely multifactorial given above - Delirium precautions.   - PT/OT  Unintentional weight loss, poor per oral intake:  - Likely related to metastatic cancer, no anatomical explanation found on EGD.  - Dietitian consult  Gastroduodenitis:  - Continue PPI, advance diet as tolerated per GI who previously signed off.   HLD: LFT elevation noted as above, likely due to metastases. CK wnl.  - Check lipid panel, start high-intensity statin (atorvastatin 29m > 837m  COPD:  - Prn BDs. Continue trelegy    HTN:  - Hold norvasc 9m3mbut continue AV nodal agents due to PAF but permissive HTN otherwise.  PAF: Has remained in sinus rhythm (ST > NSR).  - Initiating AC as  above.  - Continue home diltiazem, metoprolol 52m qd.  Dementia with acute delirium/altered mental status from baseline:  - Has known history of episodes of delirium at previous  hospitalizations - Attempting to avoid narcotics given patient's mental status and high risk for delirium in the setting of dementia - Continue exelon  Thrombocytopenia: Stable/improving - Continue monitoring level and for bleeding.   Normocytic anemia: - Likelyy due to metastatic disease. B12 sufficient, ferritin grossly elevated with low TIBC - Monitor telemetry  DVT prophylaxis: IV heparin (holding periprocedurally) Code Status: Full Family Communication: Grandson at bedside Disposition Plan:  Status is: Inpatient  Remains inpatient appropriate because:Altered mental status, Ongoing diagnostic testing needed not appropriate for outpatient work up, and Unsafe d/c plan  Dispo: The patient is from: Home              Anticipated d/c is to:  TBD  - PT/OT pending.              Patient currently is not medically stable to d/c.   Difficult to place patient No  Consultants:  GI Oncology IR  Procedures:  EGD 06/02/2021 Dr. PHilarie Fredrickson Impression:        - Normal esophagus.                           - Gastroduodenitis without bleeding. Biopsied.                           - No upper GI source of malignancy. Recommendation:- Return patient to hospital ward for ongoing care.                           - Advance diet as tolerated and liver biopsy.                           - Continue present medications. Resume heparin when cleared by IR after liver biopsy.                           - Await pathology results.                           - Current plan is for IR guided liver biopsy of                           liver tumor. At present no imminent plans for                           colonoscopy based on patient's overall current                           condition, no tumor seen on multiple CTs, and plan                           for biopsy of liver mass. Anticipate oncology                           consult once diagnosis confirmed.                           -  GI will sign off, but remain  available for                           questions.  Antimicrobials: Vancomycin, aztreonam, flagyl   Subjective: No acute issues or events overnight, patient resting comfortably this morning, continues to complain of diffuse pain, most notably in her legs which were negative on exam.  Review of systems and interaction is quite poor otherwise patient is somewhat restless.  Objective: Vitals:   06/02/21 1910 06/02/21 2052 06/03/21 0340 06/03/21 0400  BP: (!) 122/55 (!) 122/53 (!) 110/41   Pulse: 87 82 85   Resp: 18 19 (!) 21 18  Temp:  (!) 97.3 F (36.3 C) 98.4 F (36.9 C)   TempSrc:  Axillary Oral   SpO2: 100% 100% 100%   Weight:      Height:        Intake/Output Summary (Last 24 hours) at 06/03/2021 0758 Last data filed at 06/03/2021 0440 Gross per 24 hour  Intake 727.93 ml  Output 300 ml  Net 427.93 ml    Filed Weights   05/31/21 2100  Weight: 59 kg   Gen: 75 y.o. female in no distress Pulm: Nonlabored breathing room air. Clear. CV: Regular rate and rhythm. No JVD, no pitting dependent edema. GI: Abdomen nondistended Ext: Warm, no deformities Skin: No new rashes, lesions or ulcers on visualized skin. Neuro: Unable to assess orientation today, interactive and follows simple commands occasionally but not consistently Psych: Judgement and insight appear impaired. Mood euthymic & affect congruent.    Data Reviewed: I have personally reviewed following labs and imaging studies  CBC: Recent Labs  Lab 05/31/21 1711 06/01/21 0556 06/02/21 0233 06/03/21 0014  WBC 14.4* 13.6* 11.8* 16.3*  NEUTROABS 10.3*  --   --   --   HGB 10.4* 9.5* 9.3* 8.1*  HCT 31.2* 30.4* 27.9* 24.2*  MCV 86.0 91.3 85.1 84.9  PLT 103* 112* 147* 131*    Basic Metabolic Panel: Recent Labs  Lab 05/31/21 1711 06/01/21 0556  NA 134* 134*  K 3.7 3.3*  CL 92* 98  CO2 28 23  GLUCOSE 102* 111*  BUN 21 17  CREATININE 0.71 0.53  CALCIUM 8.8* 8.2*    GFR: Estimated Creatinine Clearance: 54.9  mL/min (by C-G formula based on SCr of 0.53 mg/dL). Liver Function Tests: Recent Labs  Lab 05/31/21 1711 06/01/21 0556  AST 133* 110*  ALT 45* 41  ALKPHOS 814* 565*  BILITOT 2.5* 2.6*  PROT 6.9 6.0*  ALBUMIN 2.6* 2.3*    No results for input(s): LIPASE, AMYLASE in the last 168 hours. Recent Labs  Lab 06/01/21 1605  AMMONIA 51*    Coagulation Profile: Recent Labs  Lab 05/31/21 1711  INR 1.5*    Cardiac Enzymes: Recent Labs  Lab 05/31/21 1803  CKTOTAL 81    BNP (last 3 results) No results for input(s): PROBNP in the last 8760 hours. HbA1C: Recent Labs    06/03/21 0014  HGBA1C 5.1   CBG: No results for input(s): GLUCAP in the last 168 hours. Lipid Profile: Recent Labs    06/03/21 0014  CHOL 93  HDL 15*  LDLCALC 61  TRIG 85  CHOLHDL 6.2   Thyroid Function Tests: Recent Labs    06/01/21 0653  TSH 2.222    Anemia Panel: Recent Labs    06/01/21 0556 06/01/21 0653  VITAMINB12  --  3,278*  FOLATE  --  13.0  FERRITIN  --  1,079*  TIBC  --  155*  IRON  --  19*  RETICCTPCT 3.6*  --     Urine analysis:    Component Value Date/Time   COLORURINE YELLOW 06/01/2021 0224   APPEARANCEUR HAZY (A) 06/01/2021 0224   LABSPEC 1.043 (H) 06/01/2021 0224   PHURINE 5.0 06/01/2021 0224   GLUCOSEU NEGATIVE 06/01/2021 0224   HGBUR MODERATE (A) 06/01/2021 0224   BILIRUBINUR NEGATIVE 06/01/2021 0224   KETONESUR 5 (A) 06/01/2021 0224   PROTEINUR 30 (A) 06/01/2021 0224   UROBILINOGEN 1.0 09/17/2011 1948   NITRITE NEGATIVE 06/01/2021 0224   LEUKOCYTESUR NEGATIVE 06/01/2021 0224   Recent Results (from the past 240 hour(s))  Blood Culture (routine x 2)     Status: None (Preliminary result)   Collection Time: 05/31/21  5:08 PM   Specimen: BLOOD  Result Value Ref Range Status   Specimen Description BLOOD LEFT ANTECUBITAL  Final   Special Requests   Final    BOTTLES DRAWN AEROBIC AND ANAEROBIC Blood Culture adequate volume   Culture   Final    NO GROWTH 2  DAYS Performed at [facility], [place] [address], [place], [place] [address]    Report Status PENDING  Incomplete  Blood Culture (routine x 2)     Status: None (Preliminary result)   Collection Time: 05/31/21  5:11 PM   Specimen: BLOOD  Result Value Ref Range Status   Specimen Description BLOOD LEFT ANTECUBITAL  Final   Special Requests   Final    AEROBIC BOTTLE ONLY Blood Culture results may not be optimal due to an inadequate volume of blood received in culture bottles   Culture   Final    NO GROWTH 2 DAYS Performed at [facility], [place] [address] [place], [place] [address]    Report Status PENDING  Incomplete  Resp Panel by RT-PCR (Flu A&B, Covid) Nasopharyngeal Swab     Status: None   Collection Time: 05/31/21  7:12 PM   Specimen: Nasopharyngeal Swab; Nasopharyngeal(NP) swabs in vial transport medium  Result Value Ref Range Status   SARS Coronavirus 2 by RT PCR NEGATIVE NEGATIVE Final    Comment: (NOTE) SARS-CoV-2 target nucleic acids are NOT DETECTED.  The SARS-CoV-2 RNA is generally detectable in upper respiratory specimens during the acute phase of infection. The lowest concentration of SARS-CoV-2 viral copies this assay can detect is 138 copies/mL. A negative result does not preclude SARS-Cov-2 infection and should not be used as the sole basis for treatment or other patient management decisions. A negative result may occur with  improper specimen collection/handling, submission of specimen other than nasopharyngeal swab, presence of viral mutation(s) within the areas targeted by this assay, and inadequate number of viral copies(<138 copies/mL). A negative result must be combined with clinical observations, patient history, and epidemiological information. The expected result is Negative.  Fact Sheet for Patients:  EntrepreneurPulse.com.au  Fact Sheet for Healthcare Providers:  IncredibleEmployment.be  This test is no t  yet approved or cleared by the [place] FDA and  has been authorized for detection and/or diagnosis of SARS-CoV-2 by FDA under an Emergency Use Authorization (EUA). This EUA will remain  in effect (meaning this test can be used) for the duration of the COVID-19 declaration under Section 564(b)(1) of the Act, 21 U.S.C.section 360bbb-3(b)(1), unless the authorization is terminated  or revoked sooner.       Influenza A by PCR NEGATIVE NEGATIVE Final   Influenza B by  PCR NEGATIVE NEGATIVE Final    Comment: (NOTE) The Xpert Xpress SARS-CoV-2/FLU/RSV plus assay is intended as an aid in the diagnosis of influenza from Nasopharyngeal swab specimens and should not be used as a sole basis for treatment. Nasal washings and aspirates are unacceptable for Xpert Xpress SARS-CoV-2/FLU/RSV testing.  Fact Sheet for Patients: EntrepreneurPulse.com.au  Fact Sheet for Healthcare Providers: IncredibleEmployment.be  This test is not yet approved or cleared by the [place] FDA and has been authorized for detection and/or diagnosis of SARS-CoV-2 by FDA under an Emergency Use Authorization (EUA). This EUA will remain in effect (meaning this test can be used) for the duration of the COVID-19 declaration under Section 564(b)(1) of the Act, 21 U.S.C. section 360bbb-3(b)(1), unless the authorization is terminated or revoked.  Performed at [facility], [place] [address], [place], [place] [address]   Urine Culture     Status: Abnormal   Collection Time: 06/01/21  2:42 AM   Specimen: In/Out Cath Urine  Result Value Ref Range Status   Specimen Description IN/OUT CATH URINE  Final   Special Requests   Final    NONE Performed at [facility], [place] [address] [place], [place] [address]    Culture MULTIPLE SPECIES PRESENT, SUGGEST RECOLLECTION (A)  Final   Report Status 06/02/2021 FINAL  Final      Radiology Studies: MR ANGIO HEAD WO  CONTRAST  Result Date: 06/02/2021 CLINICAL DATA:  Neuro deficit, acute, stroke suspected EXAM: MRA HEAD WITHOUT CONTRAST TECHNIQUE: Angiographic images of the Circle of Willis were acquired using MRA technique without intravenous contrast. COMPARISON:  No pertinent prior exam. FINDINGS: POSTERIOR CIRCULATION: --Vertebral arteries: Normal --Inferior cerebellar arteries: Normal. --Basilar artery: Normal. --Superior cerebellar arteries: Normal. --Posterior cerebral arteries: Normal. ANTERIOR CIRCULATION: --Intracranial internal carotid arteries: Normal. --Anterior cerebral arteries (ACA): Normal. --Middle cerebral arteries (MCA): Normal. ANATOMIC VARIANTS: Fetal origins of both posterior cerebral arteries. IMPRESSION: Normal intracranial MRA. Electronically Signed   By: Ulyses Jarred M.D.   On: 06/02/2021 22:53   MR BRAIN WO CONTRAST  Result Date: 06/01/2021 CLINICAL DATA:  Neuro deficit, acute, stroke suspected EXAM: MRI HEAD WITHOUT CONTRAST TECHNIQUE: Multiplanar, multiecho pulse sequences of the brain and surrounding structures were obtained without intravenous contrast. COMPARISON:  None. FINDINGS: Brain: Multifocal bilateral acute or early subacute ischemia within the MCA territories. There is also punctate focus of acute ischemia in the cerebellar vermis. No acute or chronic hemorrhage. There is multifocal hyperintense T2-weighted signal within the white matter. Generalized volume loss without a clear lobar predilection. The midline structures are normal. Vascular: Major flow voids are preserved. Skull and upper cervical spine: Normal calvarium and skull base. Visualized upper cervical spine and soft tissues are normal. Sinuses/Orbits:No paranasal sinus fluid levels or advanced mucosal thickening. No mastoid or middle ear effusion. Normal orbits. IMPRESSION: 1. Multifocal bilateral acute or early subacute ischemia within the MCA territories. No hemorrhage or mass effect. 2. Punctate focus of acute ischemia  in the cerebellar vermis. 3. Atrophy and findings of chronic microvascular ischemia. Electronically Signed   By: Ulyses Jarred M.D.   On: 06/01/2021 22:26   IR US Guide Bx Asp/Drain  Result Date: 06/02/2021 INDICATION: 75 year old female with a known metastatic disease to the liver, presents for biopsy EXAM: IR ULTRASOUND GUIDANCE MEDICATIONS: None. ANESTHESIA/SEDATION: Moderate (conscious) sedation was employed during this procedure. A total of Versed 1.5 mg and Fentanyl 70 mcg was administered intravenously. Moderate Sedation Time: 10 minutes. The patient's level of consciousness and vital signs  were monitored continuously by radiology nursing throughout the procedure under my direct supervision. FLUOROSCOPY TIME:  Ultrasound COMPLICATIONS: None PROCEDURE: Informed written consent was obtained from the patient's family after a thorough discussion of the procedural risks, benefits and alternatives. All questions were addressed. Maximal Sterile Barrier Technique was utilized including caps, mask, sterile gowns, sterile gloves, sterile drape, hand hygiene and skin antiseptic. A timeout was performed prior to the initiation of the procedure. Ultrasound survey of the right liver lobe performed with images stored and sent to PACs. The right lower thorax/right upper abdomen was prepped with chlorhexidine in a sterile fashion, and a sterile drape was applied covering the operative field. A sterile gown and sterile gloves were used for the procedure. Local anesthesia was provided with 1% Lidocaine. The patient was prepped and draped sterilely and the skin and subcutaneous tissues were generously infiltrated with 1% lidocaine. A 17 gauge introducer needle was then advanced under ultrasound guidance in an intercostal location into the right liver lobe, targeting heterogeneously hypoechoic mass of the right liver. The stylet was removed, and multiple separate 18 gauge core biopsy were retrieved. Samples were placed into  formalin for transportation to the lab. Gel-Foam pledgets were then infused with a small amount of saline for assistance with hemostasis. The needle was removed, and a final ultrasound image was performed. The patient tolerated the procedure well and remained hemodynamically stable throughout. No complications were encountered and no significant blood loss was encounter. IMPRESSION: Status post ultrasound-guided liver mass biopsy. Signed, Dulcy Fanny. Dellia Nims, RPVI [facility] Electronically Signed   By: Corrie Mckusick D.O.   On: 06/02/2021 14:06   ECHOCARDIOGRAM COMPLETE  Result Date: 06/02/2021    ECHOCARDIOGRAM REPORT   Patient Name:   BLOSSOM CRUME Date of Exam: 06/02/2021 Medical Rec #:  [id number]           Height:       67.0 in Accession #:    [id number]          Weight:       130.0 lb Date of Birth:  [birth year]            BSA:          1.684 m Patient Age:    68 years            BP:           115/50 mmHg Patient Gender: F                   HR:           78 bpm. Exam Location:  Inpatient Procedure: 2D Echo, Cardiac Doppler and Color Doppler Indications:    Pulmonary embolus  History:        Patient has no prior history of Echocardiogram examinations.                 COPD, Arrythmias:Atrial Fibrillation; Risk Factors:Hypertension                 and Dyslipidemia. H/O AAA s/p repair.  Sonographer:    Clayton Lefort RDCS ([place]) Referring Phys: [place]  1. Left ventricular ejection fraction, by estimation, is 55 to 60%. The left ventricle has normal function. The left ventricle has no regional wall motion abnormalities. There is moderate concentric left ventricular hypertrophy. Left ventricular diastolic parameters were normal.  2. Right ventricular systolic function is normal. The right ventricular size is normal.  There is normal pulmonary artery systolic pressure.  3. The mitral valve is normal in structure. No evidence of mitral  valve regurgitation. No evidence of mitral stenosis.  4. The aortic valve is normal in structure. Aortic valve regurgitation is mild. No aortic stenosis is present. FINDINGS  Left Ventricle: Left ventricular ejection fraction, by estimation, is 55 to 60%. The left ventricle has normal function. The left ventricle has no regional wall motion abnormalities. The left ventricular internal cavity size was normal in size. There is  moderate concentric left ventricular hypertrophy. Left ventricular diastolic parameters were normal. Right Ventricle: The right ventricular size is normal. Right vetricular wall thickness was not well visualized. Right ventricular systolic function is normal. There is normal pulmonary artery systolic pressure. The tricuspid regurgitant velocity is 2.35 m/s, and with an assumed right atrial pressure of 8 mmHg, the estimated right ventricular systolic pressure is 73.5 mmHg. Left Atrium: Left atrial size was normal in size. Right Atrium: Right atrial size was normal in size. Pericardium: There is no evidence of pericardial effusion. Mitral Valve: The mitral valve is normal in structure. No evidence of mitral valve regurgitation. No evidence of mitral valve stenosis. Tricuspid Valve: The tricuspid valve is grossly normal. Tricuspid valve regurgitation is trivial. Aortic Valve: The aortic valve is normal in structure. Aortic valve regurgitation is mild. No aortic stenosis is present. Aortic valve mean gradient measures 2.0 mmHg. Aortic valve peak gradient measures 4.2 mmHg. Aortic valve area, by VTI measures 2.95 cm. Pulmonic Valve: The pulmonic valve was grossly normal. Pulmonic valve regurgitation is not visualized. Aorta: The aortic root and ascending aorta are structurally normal, with no evidence of dilitation. IAS/Shunts: The atrial septum is grossly normal.  LEFT VENTRICLE PLAX 2D LVIDd:         3.80 cm  Diastology LVIDs:         2.60 cm  LV e' medial:    10.60 cm/s LV PW:         1.40 cm   LV E/e' medial:  7.1 LV IVS:        1.50 cm  LV e' lateral:   11.10 cm/s LVOT diam:     2.20 cm  LV E/e' lateral: 6.7 LV SV:         65 LV SV Index:   39 LVOT Area:     3.80 cm  RIGHT VENTRICLE             IVC RV Basal diam:  2.70 cm     IVC diam: 0.90 cm RV S prime:     11.20 cm/s TAPSE (M-mode): 1.9 cm LEFT ATRIUM             Index       RIGHT ATRIUM           Index LA diam:        3.50 cm 2.08 cm/m  RA Area:     11.40 cm LA Vol (A2C):   49.6 ml 29.45 ml/m RA Volume:   21.50 ml  12.77 ml/m LA Vol (A4C):   33.5 ml 19.89 ml/m LA Biplane Vol: 42.3 ml 25.12 ml/m  AORTIC VALVE AV Area (Vmax):    3.20 cm AV Area (Vmean):   2.95 cm AV Area (VTI):     2.95 cm AV Vmax:           103.00 cm/s AV Vmean:          73.500 cm/s AV VTI:  0.222 m AV Peak Grad:      4.2 mmHg AV Mean Grad:      2.0 mmHg LVOT Vmax:         86.80 cm/s LVOT Vmean:        57.000 cm/s LVOT VTI:          0.172 m LVOT/AV VTI ratio: 0.77  AORTA Ao Root diam: 3.70 cm MITRAL VALVE                TRICUSPID VALVE MV Area (PHT): 3.72 cm     TR Peak grad:   22.1 mmHg MV Decel Time: 204 msec     TR Vmax:        235.00 cm/s MV E velocity: 74.90 cm/s MV A velocity: 119.00 cm/s  SHUNTS MV E/A ratio:  0.63         Systemic VTI:  0.17 m                             Systemic Diam: 2.20 cm Mertie Moores MD Electronically signed by Mertie Moores MD Signature Date/Time: 06/02/2021/12:35:38 PM    Final    VAS US CAROTID  Result Date: 06/02/2021 Carotid Arterial Duplex Study Patient Name:  BETSY ROSELLO  Date of Exam:   06/02/2021 Medical Rec #: [id number]            Accession #:    [id number] Date of Birth: 06-29-1946             Patient Gender: F Patient Age:   64 years Exam Location:  [facility] Procedure:      VAS US CAROTID Referring Phys: Alferd Patee [facility] --------------------------------------------------------------------------------  Indications:  CVA. Risk Factors: Hypertension, hyperlipidemia, current smoker. Performing Technologist:  Darlin Coco RDMS,RVT  Examination Guidelines: A complete evaluation includes B-mode imaging, spectral Doppler, color Doppler, and power Doppler as needed of all accessible portions of each vessel. Bilateral testing is considered an integral part of a complete examination. Limited examinations for reoccurring indications may be performed as noted.  Right Carotid Findings: +----------+--------+--------+--------+---------------------+------------------+           PSV cm/sEDV cm/sStenosisPlaque Description   Comments           +----------+--------+--------+--------+---------------------+------------------+ CCA Prox  63      9                                    intimal thickening +----------+--------+--------+--------+---------------------+------------------+ CCA Distal58      8                                    intimal thickening +----------+--------+--------+--------+---------------------+------------------+ ICA Prox  48      10      1-39%   hyperechoic,                                                              heterogenous and  irregular                               +----------+--------+--------+--------+---------------------+------------------+ ICA Distal83      18                                                      +----------+--------+--------+--------+---------------------+------------------+ ECA       81                                                              +----------+--------+--------+--------+---------------------+------------------+ +----------+--------+-------+----------------+-------------------+           PSV cm/sEDV cmsDescribe        Arm Pressure (mmHG) +----------+--------+-------+----------------+-------------------+ [id number]            Multiphasic, WNL                    +----------+--------+-------+----------------+-------------------+  +---------+--------+--+--------+--+---------+ VertebralPSV cm/s37EDV cm/s10Antegrade +---------+--------+--+--------+--+---------+  Left Carotid Findings: +----------+--------+--------+--------+--------------------+-------------------+           PSV cm/sEDV cm/sStenosisPlaque Description  Comments            +----------+--------+--------+--------+--------------------+-------------------+ CCA Prox  90      9                                   intimal thickening  +----------+--------+--------+--------+--------------------+-------------------+ CCA Distal71      9                                   intimal thickening  +----------+--------+--------+--------+--------------------+-------------------+ ICA Prox  220     44      60-79%  hyperechoic,        Velocities may                                        heterogenous,       underestimate                                         irregular and       degree of stenosis                                    calcific            due to more                                                               proximal  obstruction.        +----------+--------+--------+--------+--------------------+-------------------+ ICA Mid   174     26                                                      +----------+--------+--------+--------+--------------------+-------------------+ ICA Distal81      22                                                      +----------+--------+--------+--------+--------------------+-------------------+ ECA       70                                                              +----------+--------+--------+--------+--------------------+-------------------+ +----------+--------+--------+--------+-------------------+           PSV cm/sEDV cm/sDescribeArm Pressure (mmHG)  +----------+--------+--------+--------+-------------------+ Subclavian307             Stenotic                    +----------+--------+--------+--------+-------------------+ +---------+--------+--+--------+--+---------+ VertebralPSV cm/s46EDV cm/s11Antegrade +---------+--------+--+--------+--+---------+   Summary: Right Carotid: Velocities in the right ICA are consistent with a 1-39% stenosis. Left Carotid: Velocities in the left ICA are consistent with a 60-79% stenosis               by ICA/CCA ratio. Vertebrals:  Bilateral vertebral arteries demonstrate antegrade flow. Subclavians: Left subclavian artery was stenotic. Normal flow hemodynamics were              seen in the right subclavian artery. *See table(s) above for measurements and observations.     Preliminary     Scheduled Meds:  atorvastatin  80 mg Oral Daily   diltiazem  180 mg Oral Daily   feeding supplement  237 mL Oral TID BM   fluticasone furoate-vilanterol  1 puff Inhalation Daily   metoprolol succinate  25 mg Oral Daily   multivitamin with minerals  1 tablet Oral Daily   pantoprazole  40 mg Oral Q0600   rivastigmine  4.5 mg Oral BID   umeclidinium bromide  1 puff Inhalation Daily   Continuous Infusions:  aztreonam 1 g (06/03/21 0131)   heparin 1,400 Units/hr (06/03/21 0721)   metronidazole 500 mg (06/03/21 0254)   vancomycin 750 mg (06/02/21 2132)     LOS: 3 days   Time spent: 35 minutes.  Little Ishikawa, DO [facility] Pager: Secure chat  06/03/2021, 7:58 AM

## 2021-06-03 NOTE — Progress Notes (Signed)
Was informed earlier that pt was not taking PO meds and did not drink PO KCl. Messaged Dr. Avon Gully and given ok to switch to IV KCl. RN informed that pt did drink PO KCl. Will switch back to PO Kcl.   Thank you for involving pharmacy in this patient's care.  Elita Quick, PharmD PGY1 Ambulatory Care Pharmacy Resident 06/03/2021 10:51 AM  **Pharmacist phone directory can be found on [email or web address] listed under [place]**

## 2021-06-03 NOTE — Progress Notes (Signed)
ANTICOAGULATION CONSULT NOTE - Follow Up Consult  Pharmacy Consult for heparin Indication: pulmonary embolus  Labs: Recent Labs    05/31/21 1711 05/31/21 1803 06/01/21 0556 06/01/21 0653 06/01/21 1605 06/02/21 0233 06/02/21 2242  HGB 10.4*  --  9.5*  --   --  9.3*  --   HCT 31.2*  --  30.4*  --   --  27.9*  --   PLT 103*  --  112*  --   --  147*  --   APTT 38*  --   --   --   --   --   --   LABPROT 18.0*  --   --   --   --   --   --   INR 1.5*  --   --   --   --   --   --   HEPARINUNFRC  --   --   --  0.15* 0.26*  --  <0.10*  CREATININE 0.71  --  0.53  --   --   --   --   CKTOTAL  --  81  --   --   --   --   --     Assessment: 75yo female subtherapeutic on heparin after resuming post-procedure though may need more time to accumulate; no infusion issues or signs of bleeding per RN.  Goal of Therapy:  Heparin level 0.3-0.7 units/ml   Plan:  Will increase heparin infusion by 1-2 units/kg/hr to 1400 units/hr and check level in 8 hours.    Wynona Neat, PharmD, BCPS  06/03/2021,12:05 AM

## 2021-06-03 NOTE — Progress Notes (Signed)
ANTICOAGULATION CONSULT NOTE - Follow Up Consult  Pharmacy Consult for heparin Indication: pulmonary embolus  Labs: Recent Labs    05/31/21 1711 05/31/21 1803 06/01/21 0556 06/01/21 0653 06/01/21 1605 06/02/21 0233 06/02/21 2242 06/03/21 0014 06/03/21 0724 06/03/21 0733  HGB 10.4*  --  9.5*  --   --  9.3*  --  8.1*  --   --   HCT 31.2*  --  30.4*  --   --  27.9*  --  24.2*  --   --   PLT 103*  --  112*  --   --  147*  --  131*  --   --   APTT 38*  --   --   --   --   --   --   --   --   --   LABPROT 18.0*  --   --   --   --   --   --   --   --   --   INR 1.5*  --   --   --   --   --   --   --   --   --   HEPARINUNFRC  --   --   --    < > 0.26*  --  <0.10*  --  0.37  --   CREATININE 0.71  --  0.53  --   --   --   --   --   --  0.52  CKTOTAL  --  81  --   --   --   --   --   --   --   --    < > = values in this interval not displayed.     Assessment: Anne West is a 48 YOF who presented with confusion, lower abdomina pain, multiple falls in the last week, slurred speech and having issues swallowing (no eating or drinking). On chest CT, found small segmental pulmonary emboli within the right upper, middle and lower lobes and upper left lobe. She ios s/p endoscopy with no intervention and had liver biopsy on 8/05. Pharmacy consulted to dose heparin.   Heparin level 0.37 and therapeutic. Hgb decreased slightly (9.3 > 8.1), plt are WNL. No signs/symptoms of bleeding and no IV site issues per RN.   Goal of Therapy:  Heparin level 0.3-0.7 units/ml   Plan:  Continue IV heparin gtt at 1400 units/h Confirmatory 6h heparin level check Daily heparin level, CBC Monitor s/sx of bleeding F/u PO anticoagulation plan  Thank you for involving pharmacy in this patient's care.  Elita Quick, PharmD PGY1 Ambulatory Care Pharmacy Resident 06/03/2021 8:25 AM  **Pharmacist phone directory can be found on [email or web address] listed under [place]**

## 2021-06-03 NOTE — Progress Notes (Signed)
Occupational Therapy Treatment Patient Details Name: Anne West MRN: [id number] DOB: 02-26-1946 Today's Date: 06/03/2021    History of present illness 75 y.o. female presents to [facility] ED with reports of confusion, slurred speech, abdominal pain, falls, and dysphagia. Pt recently diagnosed with liver mets with unknown primary malignancy. Pt found to have acute subsegmental PE. MRI 8/4 shows Multifocal bilateral acute or early subacute ischemia within the  MCA territories. Pt underwent upper GI endoscopy on 8/5. PMH includes dementia, AAA repair, A. fib not on anticoagulation, COPD, hypertension, hyperlipidemia, dementia.   OT comments  Pt not making progress towards OT goals this session. Pt is limited by pain in her abdomen and was unable to tolerate more functional mobility than to complete bed mobility and stand to transfer to the chair. Requiring min A at this time for functional mobility and presenting with poor activity tolerance. OT will follow acutely.    Follow Up Recommendations  Home health OT;Supervision/Assistance - 24 hour    Equipment Recommendations  Tub/shower bench    Recommendations for Other Services      Precautions / Restrictions Precautions Precautions: Fall Restrictions Weight Bearing Restrictions: No       Mobility Bed Mobility Overal bed mobility: Needs Assistance Bed Mobility: Supine to Sit     Supine to sit: Mod assist;HOB elevated     General bed mobility comments: Mod to bring BLE off the bed, pivot and elevate trunk to sitting.    Transfers Overall transfer level: Needs assistance Equipment used: 1 person hand held assist Transfers: Stand Pivot Transfers   Stand pivot transfers: Min assist       General transfer comment: Limited by pain, OT provided bear hug technique to stand pt and pivot her to chair    Balance Overall balance assessment: Needs assistance Sitting-balance support: Bilateral upper extremity supported;Feet  supported Sitting balance-Leahy Scale: Fair     Standing balance support: Bilateral upper extremity supported Standing balance-Leahy Scale: Poor Standing balance comment: reliant on UE support                           ADL either performed or assessed with clinical judgement   ADL Overall ADL's : Needs assistance/impaired                         Toilet Transfer: Minimal assistance;Stand-pivot Toilet Transfer Details (indicate cue type and reason): Bear hug to stand and pivot to chair. Limited by high pain level         Functional mobility during ADLs: Minimal assistance General ADL Comments: Pain limiting performance     Vision   Vision Assessment?: No apparent visual deficits   Perception     Praxis      Cognition Arousal/Alertness: Awake/alert Behavior During Therapy: Flat affect Overall Cognitive Status: History of cognitive impairments - at baseline                                 General Comments: Pt has h/o dementia        Exercises     Shoulder Instructions       General Comments VSS    Pertinent Vitals/ Pain       Pain Assessment: Faces Faces Pain Scale: Hurts whole lot Pain Location: Stomach Pain Descriptors / Indicators: Grimacing;Moaning Pain Intervention(s): Limited activity within patient's tolerance;Monitored during session;Repositioned  Home  Living                                          Prior Functioning/Environment              Frequency  Min 2X/week        Progress Toward Goals  OT Goals(current goals can now be found in the care plan section)  Progress towards OT goals: Progressing toward goals  Acute Rehab OT Goals Patient Stated Goal: To stop the pain OT Goal Formulation: With patient/family Time For Goal Achievement: 06/16/21 Potential to Achieve Goals: Good ADL Goals Pt Will Perform Grooming: with modified independence;standing Pt Will Perform Lower Body  Bathing: with modified independence;sit to/from stand;sitting/lateral leans Pt Will Perform Lower Body Dressing: with modified independence;sitting/lateral leans;sit to/from stand Pt Will Transfer to Toilet: with modified independence;ambulating Pt Will Perform Toileting - Clothing Manipulation and hygiene: with modified independence;sitting/lateral leans;sit to/from stand  Plan Discharge plan remains appropriate;Frequency remains appropriate    Co-evaluation                 AM-PAC OT "6 Clicks" Daily Activity     Outcome Measure   Help from another person eating meals?: A Little Help from another person taking care of personal grooming?: A Little Help from another person toileting, which includes using toliet, bedpan, or urinal?: A Little Help from another person bathing (including washing, rinsing, drying)?: A Little Help from another person to put on and taking off regular upper body clothing?: A Little Help from another person to put on and taking off regular lower body clothing?: A Little 6 Click Score: 18    End of Session Equipment Utilized During Treatment: Gait belt  OT Visit Diagnosis: Unsteadiness on feet (R26.81);Other abnormalities of gait and mobility (R26.89);Muscle weakness (generalized) (M62.81)   Activity Tolerance Patient limited by fatigue;Patient limited by pain   Patient Left in chair;with call bell/phone within reach;with family/visitor present   Nurse Communication Mobility status        Time: [id number] OT Time Calculation (min): 17 min  Charges: OT General Charges $OT Visit: 1 Visit OT Treatments $Self Care/Home Management : 8-22 mins  Cristo Ausburn H., OTR/L [place] 06/03/2021, 5:37 PM

## 2021-06-03 NOTE — Progress Notes (Addendum)
STROKE TEAM PROGRESS NOTE   ATTENDING NOTE: I reviewed above note and agree with the assessment and plan. Pt was seen and examined.   75 year old female with history of A. fib not on AC, hypertension, hyperlipidemia, dementia, AAA status post repair, abdominal pain with recent diagnosis of multifocal liver lesions concerning for metastasis who was admitted for no appetite, confusion, slurred speech and falls at home.  Reported initial temperature of 100.4, elevated HR, WBC 14.4, AST/ALT 133/145.  CT abdomen however, found to have PE and multiple liver mass.  MRI brain showed bilateral MCA punctate infarct, as well as punctate cerebellum vermis infarct.  MRA negative, EF 55 to 60%, carotid Doppler left ICA 60 to 79% stenosis.  A1c 5.1, LDL 61.  WBC 11.8-> 16.3.  Hemoglobin 9.3-> 8.1.  Creatinine 0.52.  During rounding, grandson is at the bedside. Pt lying in bed, mildly lethargic and restless. Had liver biopsy yesterday, result pending. Still on heparin IV and antibiotics. Grandson said pt has no appetite, but takes medication with encouragement. Tele showed not in afib.  On exam, patient awake, alert, mildly lethargic and restless, eyes open, orientated to age,and people but not to time, situation and place. No aphasia, however, paucity of speech, able to name 2/2 and repeat, following most simple commands with repeated encouragement. No gaze palsy, tracking bilaterally, visual field full, PERRL. Left mild facial droop. Tongue protrusion not cooperative. Bilateral UEs 4/5, no drift. Bilaterally LEs 3+/5, no drift. Sensation decreased on the left subjectively, FTN not cooperative, gait not tested.   Etiology for patient stroke likely due to hypercoagulable state in the setting of liver metastasis vs. A. fib not on AC.  Patient PE found on CT abdomen pelvis also support this etiology.  Currently on heparin IV and Lipitor 40.  Recommend heparin IV transition to oral anticoagulation once hemoglobin stabilized.   Okay to continue Lipitor 40, but need close monitoring of AST/ALT.  For detailed assessment and plan, please refer to above as I have made changes wherever appropriate.   Neurology will sign off. Please call with questions. Pt will follow up with stroke clinic NP at [facility] in about 4 weeks. Thanks for the consult.   Anne Hawking, MD PhD Stroke Neurology 06/03/2021 11:38 PM    INTERVAL HISTORY  No acute events. Anne West, is at bedside. Patient is not attentive or conversant. Anne West reports patient has been uncomfortable today and spitting out most medications. She is restless and complaining of abd and back pain currently. She does not participate in conversation. Stroke plan of care discussed with grandson.   Vitals:   06/03/21 0400 06/03/21 0836 06/03/21 0923 06/03/21 1020  BP:   (!) 126/50 (!) 143/79  Pulse:   85 95  Resp: 18   (!) 21  Temp:      TempSrc:      SpO2:  98%  100%  Weight:      Height:       CBC:  Recent Labs  Lab 05/31/21 1711 06/01/21 0556 06/02/21 0233 06/03/21 0014  WBC 14.4*   < > 11.8* 16.3*  NEUTROABS 10.3*  --   --   --   HGB 10.4*   < > 9.3* 8.1*  HCT 31.2*   < > 27.9* 24.2*  MCV 86.0   < > 85.1 84.9  PLT 103*   < > 147* 131*   < > = values in this interval not displayed.   Basic Metabolic Panel:  Recent Labs  Lab  06/01/21 0556 06/03/21 0733  NA 134* 135  K 3.3* 2.8*  CL 98 101  CO2 23 24  GLUCOSE 111* 97  BUN 17 11  CREATININE 0.53 0.52  CALCIUM 8.2* 7.8*   Lipid Panel:  Recent Labs  Lab 06/03/21 0014  CHOL 93  TRIG 85  HDL 15*  CHOLHDL 6.2  VLDL 17  LDLCALC 61   HgbA1c:  Recent Labs  Lab 06/03/21 0014  HGBA1C 5.1   Urine Drug Screen: No results for input(s): LABOPIA, COCAINSCRNUR, LABBENZ, AMPHETMU, THCU, LABBARB in the last 168 hours.  Alcohol Level No results for input(s): ETH in the last 168 hours.  IMAGING past 24 hours MR ANGIO HEAD WO CONTRAST  Result Date: 06/02/2021 CLINICAL DATA:  Neuro deficit, acute,  stroke suspected EXAM: MRA HEAD WITHOUT CONTRAST TECHNIQUE: Angiographic images of the Circle of Willis were acquired using MRA technique without intravenous contrast. COMPARISON:  No pertinent prior exam. FINDINGS: POSTERIOR CIRCULATION: --Vertebral arteries: Normal --Inferior cerebellar arteries: Normal. --Basilar artery: Normal. --Superior cerebellar arteries: Normal. --Posterior cerebral arteries: Normal. ANTERIOR CIRCULATION: --Intracranial internal carotid arteries: Normal. --Anterior cerebral arteries (ACA): Normal. --Middle cerebral arteries (MCA): Normal. ANATOMIC VARIANTS: Fetal origins of both posterior cerebral arteries. IMPRESSION: Normal intracranial MRA. Electronically Signed   By: Ulyses Jarred M.D.   On: 06/02/2021 22:53    PHYSICAL EXAM  General Examination:                                                                                                      Constitutional: Frail African American female resting in bed restlessly writhing and clutching abdomen Respiratory: Unlabored respirations  Cardiac: RRR   Neurological Examination MS: Oriented to self and grandson States that she is in [employer], Able to name the season as summer. Follows most commands. Speech: Fluent with repetition and naming intact. CN: EOMI, VFF, Face symmetric  Motor: Decreased bulk, normal tone. No drift. RUE 5/5 LUE 5/5 RLE  Reflexes: 2+ symmetric throughout  Sensation: Intact to light touch  Coordination: Intact FNF  Gait: Deferred   ASSESSMENT/PLAN Anne West is a 75 y.o. female  hx of Afibb not on AC, COPD, HTN, HLD, cognitive impairment, AAA s/p repair p/w lethargy, confusion and tiredness and recent liver mets with unknown primary. 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   Small scattered bilat MCA strokes in the setting of atrial fibrillation not on anticoagulation and also likely hypercoagulable state due to cancer.   CT Head NAICP MRI Brain  1. Multifocal  bilateral acute or early subacute ischemia within the MCA territories. No hemorrhage or mass effect. 2. Punctate focus of acute ischemia in the cerebellar vermis. 3. Atrophy and findings of chronic microvascular ischemia. MRI   Multifocal bilateral acute or early subacute ischemia within the MCA territories. No hemorrhage or mass effect.Punctate focus of acute ischemia in the cerebellar vermis. 3. Atrophy and findings of chronic microvascular ischemia. MRA   Normal intracranial MRA. Carotid Doppler  Pending 2D Echo  EF 55-60%, +LVH. No thrombus, wall motion abnormality or shunt found.  LDL 61 HgbA1c 5.1 VTE prophylaxis -     Diet   DIET SOFT Room service appropriate? Yes; Fluid consistency: Thin   Atrial fibrillation not on anticoagulation prior to admission. Now on heparin drip.  Therapy recommendations:  Home health PT/OT Disposition:  TBD  Hypertension Stable Permissive hypertension (OK if < 220/120) but gradually normalize in 5-7 days Long-term BP goal normotensive  Hyperlipidemia Home meds:  Lipitor '40mg'$   LDL 61, at goal < 7 High intensity statin: Lipitor 40 mg Continue statin at discharge  Other Stroke Risk Factors Advanced Age >/= 89  Current Cigarette smoker, advised to stop smoking  Other Active Problems managed by primary team Pulmonary emboli on heparin drip  Numerous liver lesions s/p biopsy  and thickened gallbladder  Abd/back pain and discomfort: discussed with  primary team Poor compliance with po meds per nursing and family  Hospital day # 3  Delila A Bailey-Modzik, NP-C   To contact Stroke Continuity provider, please refer to [email or web address] After hours, contact General Neurology

## 2021-06-04 DIAGNOSIS — Z7189 Other specified counseling: Secondary | ICD-10-CM

## 2021-06-04 LAB — BASIC METABOLIC PANEL
Anion gap: 7 (ref 5–15)
BUN: 9 mg/dL (ref 8–23)
CO2: 28 mmol/L (ref 22–32)
Calcium: 8.1 mg/dL — ABNORMAL LOW (ref 8.9–10.3)
Chloride: 102 mmol/L (ref 98–111)
Creatinine, Ser: 0.53 mg/dL (ref 0.44–1.00)
GFR, Estimated: >60 mL/min (ref >60)
Glucose, Bld: 99 mg/dL (ref 70–99)
Potassium: 3.1 mmol/L — ABNORMAL LOW (ref 3.5–5.1)
Sodium: 137 mmol/L (ref 135–145)

## 2021-06-04 LAB — MAGNESIUM: Magnesium: 1.7 mg/dL (ref 1.7–2.4)

## 2021-06-04 LAB — CBC
HCT: 23.7 % — ABNORMAL LOW (ref 36.0–46.0)
Hemoglobin: 7.9 g/dL — ABNORMAL LOW (ref 12.0–15.0)
MCH: 28.8 pg (ref 26.0–34.0)
MCHC: 33.3 g/dL (ref 30.0–36.0)
MCV: 86.5 fL (ref 80.0–100.0)
Platelets: 174 10*3/uL (ref 150–400)
RBC: 2.74 MIL/uL — ABNORMAL LOW (ref 3.87–5.11)
RDW: 18.3 % — ABNORMAL HIGH (ref 11.5–15.5)
WBC: 18.3 10*3/uL — ABNORMAL HIGH (ref 4.0–10.5)
nRBC: 0 % (ref 0.0–0.2)

## 2021-06-04 LAB — HEPARIN LEVEL (UNFRACTIONATED): Heparin Unfractionated: 0.52 IU/mL (ref 0.30–0.70)

## 2021-06-04 MED ORDER — PANTOPRAZOLE SODIUM 40 MG IV SOLR
40.0000 mg | INTRAVENOUS | Status: DC
  Administered 2021-06-04 – 2021-06-05 (×2): 40 mg via INTRAVENOUS
  Filled 2021-06-04 (×2): qty 40

## 2021-06-04 MED ORDER — MORPHINE SULFATE (CONCENTRATE) 10 MG/0.5ML PO SOLN
10.0000 mg | ORAL | Status: DC | PRN
Start: 2021-06-04 — End: 2021-06-05
  Administered 2021-06-04: 10 mg via ORAL
  Filled 2021-06-04: qty 0.5

## 2021-06-04 MED ORDER — DILTIAZEM HCL 60 MG PO TABS
60.0000 mg | ORAL_TABLET | Freq: Three times a day (TID) | ORAL | Status: DC
  Administered 2021-06-05 – 2021-06-06 (×4): 60 mg via ORAL
  Filled 2021-06-04 (×4): qty 1

## 2021-06-04 MED ORDER — MAGNESIUM SULFATE 2 GM/50ML IV SOLN
2.0000 g | Freq: Once | INTRAVENOUS | Status: AC
  Administered 2021-06-04: 2 g via INTRAVENOUS
  Filled 2021-06-04: qty 50

## 2021-06-04 MED ORDER — METOPROLOL TARTRATE 12.5 MG HALF TABLET: 12.5000 mg | ORAL_TABLET | Freq: Two times a day (BID) | ORAL | Status: DC

## 2021-06-04 MED ORDER — POTASSIUM CHLORIDE 20 MEQ PO PACK
40.0000 meq | PACK | ORAL | Status: AC
  Administered 2021-06-04 (×2): 40 meq via ORAL
  Filled 2021-06-04 (×2): qty 2

## 2021-06-04 MED ORDER — DILTIAZEM HCL 60 MG PO TABS: 60.0000 mg | ORAL_TABLET | Freq: Three times a day (TID) | ORAL | Status: DC

## 2021-06-04 MED ORDER — METOPROLOL TARTRATE 12.5 MG HALF TABLET
12.5000 mg | ORAL_TABLET | Freq: Two times a day (BID) | ORAL | Status: DC
  Administered 2021-06-05 – 2021-06-06 (×3): 12.5 mg via ORAL
  Filled 2021-06-04 (×3): qty 1

## 2021-06-04 NOTE — Progress Notes (Signed)
Pharmacy Antibiotic Note  Anne West is a 75 y.o. female admitted on 05/31/2021 with sepsis.  Pharmacy has been consulted for vancomycin and aztreonam dosing (day 4 of therapy)  Renal function today remains stable. WBC has increased to 18.3, however pt remains afebrile. Blood cultures are negative. Discussed with Dr. Avon Gully and he agreed that antibiotics could be stopped (8/8) to cover for any pneumonias or urine infections. Pt has a listed allergy to "penicillins cross reactors" and per chart review may seem more of intolerance. Will plan to continue broad spectrum since only 1 more day of therapy.   Plan: Continue Vancomycin '750mg'$  IV q24h until 8/8. Continue Aztreonam until 1g IV q8h until 8/8 Monitor renal function, clinical status, and antibiotic plan   Height: '5\' 7"'$  (170.2 cm) Weight: 59 kg (130 lb) IBW/kg (Calculated) : 61.6  Temp (24hrs), Avg:99.2 F (37.3 C), Min:99.1 F (37.3 C), Max:99.3 F (37.4 C)  Recent Labs  Lab 05/31/21 1711 06/01/21 0556 06/02/21 0233 06/03/21 0014 06/03/21 0733 06/03/21 1541 06/04/21 0152  WBC 14.4* 13.6* 11.8* 16.3*  --   --  18.3*  CREATININE 0.71 0.53  --   --  0.52 0.51 0.53  LATICACIDVEN 1.5  --   --   --   --   --   --      Estimated Creatinine Clearance: 54.9 mL/min (by C-G formula based on SCr of 0.53 mg/dL).    Allergies  Allergen Reactions   Donepezil Itching    SEVERE ITCHING   Penicillins Cross Reactors     Unknown    Sulfa Antibiotics     Antimicrobials this admission: 8/3 Vanc >> (8/8) 8/3 Aztreonam >> (8/8) Flagyl q8h dosing per MD  Dose adjustments this admission: N/A  Microbiology results: 8/3 BCx: ngtd 8/3 UCx:  multiple  Thank you for involving pharmacy in this patient's care.  Elita Quick, PharmD PGY1 Ambulatory Care Pharmacy Resident 06/04/2021 11:42 AM  **Pharmacist phone directory can be found on [email or web address] listed under [place]**

## 2021-06-04 NOTE — Progress Notes (Signed)
PROGRESS NOTE  Anne West  WGN:[id number] DOB: 07-Oct-1946 DOA: 05/31/2021 PCP: Default, Provider, MD   Brief Narrative: Anne West is a 75 y.o. female with a history of AFib not on anticoagulation, COPD, HTN, HLD, cognitive impairment, AAA s/p repair, and recent hospitalization for abdominal pain thought to be due to constipation where she was found to have numerous liver lesions and thickened gallbladder without other evidence of cholecystitis. She returned home and has continued to eat poorly, grow more confused than baseline and have several falls. Due to this and some slurred speech, her daughter brought her to the ED where she was febrile at 100.76F, tachycardic (sinus), without hypotension or hypoxia. WBC 14.4k, hgb 10.4g/dl, platelets 103k. SCr 0.7. AST 133, ALT 145, alk phos 814, TBili 2.5. Covid, flue PCR negative. Vast imaging evaluation revealed hepatic lesions, stable gallbladder wall thickening without cholelithiasis or pericholecystic fluid to suggest cholecystitis, and small bilateral pulmonary emboli.   Blood cultures were drawn, broad IV antibiotics started for SIRS (NOS), IV heparin was started for PE. GI was consulted for dysphagia, performed EGD 06/02/2021 showing non-bleeding gastroduodenitis, which was biopsied, without evidence of malignancy (full report below). IR is consulted and will perform U/S-guided biopsy of liver lesion. Oncology has made recommendations to complete initial work up and will follow up with the patient after pathology results have returned.  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 -We discussed that given her diagnosis as below her prognosis was likely quite poor given metastatic disease of unknown primary.  Family hopes to have more information about biopsy results and cancer type in the next 48 hours and to further discuss with oncology which is certainly reasonable. -Unfortunately patient continues to have somewhat worsening pain over the past 48 hours.  We discussed that pain management is certainly a priority but this would likely decrease patient's ability to interact meaningfully with family, interactive continue as well as interact with staff as she already has quite abysmal p.o. intake this would likely make her more malnourished. -Appreciate palliative care consult and further discussion with family today, appears family is requesting continuation of full CODE STATUS despite discussion that if patient were to have an acute event requiring CPR or intubation this would likely only be more harmful than beneficial given the patient's prognosis. -Continue to treat unknown source for infection with antibiotics for 5 days, disposition pending biopsy results and further discussion with oncology or pending worsening clinical status of the patient given her poor p.o. intake and ongoing encephalopathy - unclear if patient will be able to return home given her mental status and increased care needs.  bilateral MCA CVAs, acute cerebellar CVA: Distribution consistent with embolic showering. - Neurology consulted - now signed off - appreciate recs - Echo unremarkable - HDL low - lipid panel otherwise unremarkable; A1C WNL - Known history of atrial fibrillation.  - PT, OT, SLP ordered - Continue increased statin, IV heparin - transition to PO once patient more reasonably able to take PO and comply with PO medication administration  SIRS: WITHOUT source, does not  meet sepsis criteria - Tmax is 100.38F at intake - afebrile since -  fever in the setting of cancer/blood clot is not uncommon - Continue empiric abx, especially as we consider biopsy and await culture result - procalcitonin elevated at 1.5  - Continue to follow for source - Urine culture appears poor/dirty collection; Blood cultures negative thus far - stop abx after 5 days and follow for reculture/symptoms  Acute pulmonary emboli: Provoked by malignancy. Not hemodynamically compromising or causing hypoxia.  - Continue IV heparin which can be paused if needed for biopsy. - Check echocardiogram  Metastatic cancer, unknown primary: Numerous hepatic lesions on CT.  - IR biopsy planned 8/5.  - CA-125 4,263 and CEA 1,488 grossly elevated. - Oncology, Dr. Chryl Heck, consulted and discussed with the family by phone the plan for expedited oncology clinic follow up once biopsy results are back.  Dementia with acute delirium/altered mental status from baseline:  - Has known history of episodes of delirium at previous hospitalizations - Attempting to avoid narcotics given patient's mental status and high risk for delirium in the setting of dementia - Continue exelon  Falls, increasing confusion, slurred speech:  - Likely multifactorial given above - Delirium precautions.   - PT/OT  Unintentional weight loss, poor per oral intake:  - Likely related to metastatic cancer, no anatomical explanation found on EGD.  - Dietitian consult  Gastroduodenitis:  - Continue PPI, advance diet as tolerated per GI who previously signed off.   HLD: LFT elevation noted as above, likely due to metastases. CK wnl.  - Check lipid panel, start high-intensity statin (atorvastatin 21m > 879m  COPD:  - Prn BDs. Continue trelegy    HTN:  - Hold norvasc 69m68mbut continue AV nodal agents due to PAF but permissive HTN otherwise.  PAF: Has remained in sinus rhythm (ST > NSR).  - Initiating AC as above.  - Continue home diltiazem, metoprolol 269m56m.  Thrombocytopenia:  Stable/improving - Continue monitoring level and for bleeding.   Normocytic anemia: - Likelyy due to metastatic disease. B12 sufficient, ferritin grossly elevated with low TIBC - Monitor telemetry  DVT prophylaxis: IV heparin (holding periprocedurally) Code Status: Full Family Communication: Grandson at bedside Disposition Plan:  Status is: Inpatient  Remains inpatient appropriate because:Altered mental status, Ongoing diagnostic testing needed not appropriate for outpatient work up, and Unsafe d/c plan  Dispo: The patient is from: Home              Anticipated d/c is to:  TBD  - PT/OT pending.              Patient currently is not medically stable to d/c.   Difficult to place patient No  Consultants:  GI Oncology IR  Procedures:  EGD 06/02/2021 Dr. PyrtHilarie Fredricksonpression:        - Normal esophagus.                           - Gastroduodenitis without bleeding. Biopsied.                           - No upper GI source of malignancy Antimicrobials: Vancomycin, aztreonam, flagyl   Subjective: No acute issues or events overnight, patient resting comfortably this morning, continues to complain of diffuse pain - still refusing PO medication from staff (including pain meds)  Objective: Vitals:   06/03/21 1020 06/03/21 1539 06/03/21 2016 06/04/21 0444  BP: (!)Marland Kitchen  143/79  128/64 133/61  Pulse: 95 98 92 81  Resp: (!) 21 14 (!) 26 18  Temp:   99.1 F (37.3 C) 99.1 F (37.3 C)  TempSrc:   Oral Oral  SpO2: 100% 99% 93%   Weight:      Height:        Intake/Output Summary (Last 24 hours) at 06/04/2021 0834 Last data filed at 06/04/2021 0413 Gross per 24 hour  Intake 606.37 ml  Output --  Net 606.37 ml    Filed Weights   05/31/21 2100  Weight: 59 kg   Gen: 75 y.o. female in no distress Pulm: Nonlabored breathing room air. Clear. CV: Regular rate and rhythm. No JVD, no pitting dependent edema. GI: Abdomen nondistended Ext: Warm, no deformities Skin: No new rashes, lesions or  ulcers on visualized skin. Neuro: Unable to assess orientation today, interactive and follows simple commands occasionally but not consistently Psych: Judgement and insight appear impaired. Mood euthymic & affect congruent.    Data Reviewed: I have personally reviewed following labs and imaging studies  CBC: Recent Labs  Lab 05/31/21 1711 06/01/21 0556 06/02/21 0233 06/03/21 0014 06/04/21 0152  WBC 14.4* 13.6* 11.8* 16.3* 18.3*  NEUTROABS 10.3*  --   --   --   --   HGB 10.4* 9.5* 9.3* 8.1* 7.9*  HCT 31.2* 30.4* 27.9* 24.2* 23.7*  MCV 86.0 91.3 85.1 84.9 86.5  PLT 103* 112* 147* 131* 962    Basic Metabolic Panel: Recent Labs  Lab 05/31/21 1711 06/01/21 0556 06/03/21 0733 06/03/21 1541 06/04/21 0152  NA 134* 134* 135 134* 137  K 3.7 3.3* 2.8* 3.4* 3.1*  CL 92* 98 101 98 102  CO2 _0 GLUCOSE 102* 111* 97 90 99  BUN _1 CREATININE 0.71 0.53 0.52 0.51 0.53  CALCIUM 8.8* 8.2* 7.8* 8.0* 8.1*  MG  --   --   --   --  1.7    GFR: Estimated Creatinine Clearance: 54.9 mL/min (by C-G formula based on SCr of 0.53 mg/dL). Liver Function Tests: Recent Labs  Lab 05/31/21 1711 06/01/21 0556  AST 133* 110*  ALT 45* 41  ALKPHOS 814* 565*  BILITOT 2.5* 2.6*  PROT 6.9 6.0*  ALBUMIN 2.6* 2.3*    No results for input(s): LIPASE, AMYLASE in the last 168 hours. Recent Labs  Lab 06/01/21 1605  AMMONIA 51*    Coagulation Profile: Recent Labs  Lab 05/31/21 1711  INR 1.5*    Cardiac Enzymes: Recent Labs  Lab 05/31/21 1803  CKTOTAL 81    BNP (last 3 results) No results for input(s): PROBNP in the last 8760 hours. HbA1C: Recent Labs    06/03/21 0014  HGBA1C 5.1    CBG: No results for input(s): GLUCAP in the last 168 hours. Lipid Profile: Recent Labs    06/03/21 0014  CHOL 93  HDL 15*  LDLCALC 61  TRIG 85  CHOLHDL 6.2    Thyroid Function Tests: No results for input(s): TSH, T4TOTAL, FREET4, T3FREE, THYROIDAB in the last 72  hours.  Anemia Panel: No results for input(s): VITAMINB12, FOLATE, FERRITIN, TIBC, IRON, RETICCTPCT in the last 72 hours.  Urine analysis:    Component Value Date/Time   COLORURINE YELLOW 06/01/2021 0224   APPEARANCEUR HAZY (A) 06/01/2021 0224   LABSPEC 1.043 (H) 06/01/2021 0224   PHURINE 5.0 06/01/2021 0224   GLUCOSEU NEGATIVE 06/01/2021 0224   HGBUR MODERATE (A) 06/01/2021 0224   BILIRUBINUR  NEGATIVE 06/01/2021 0224   KETONESUR 5 (A) 06/01/2021 0224   PROTEINUR 30 (A) 06/01/2021 0224   UROBILINOGEN 1.0 09/17/2011 1948   NITRITE NEGATIVE 06/01/2021 0224   LEUKOCYTESUR NEGATIVE 06/01/2021 0224   Recent Results (from the past 240 hour(s))  Blood Culture (routine x 2)     Status: None (Preliminary result)   Collection Time: 05/31/21  5:08 PM   Specimen: BLOOD  Result Value Ref Range Status   Specimen Description BLOOD LEFT ANTECUBITAL  Final   Special Requests   Final    BOTTLES DRAWN AEROBIC AND ANAEROBIC Blood Culture adequate volume   Culture   Final    NO GROWTH 3 DAYS Performed at [facility], [address], [place], [place] [address]    Report Status PENDING  Incomplete  Blood Culture (routine x 2)     Status: None (Preliminary result)   Collection Time: 05/31/21  5:11 PM   Specimen: BLOOD  Result Value Ref Range Status   Specimen Description BLOOD LEFT ANTECUBITAL  Final   Special Requests   Final    AEROBIC BOTTLE ONLY Blood Culture results may not be optimal due to an inadequate volume of blood received in culture bottles   Culture   Final    NO GROWTH 3 DAYS Performed at [facility], [place] [address], [place], [place] [address]    Report Status PENDING  Incomplete  Resp Panel by RT-PCR (Flu A&B, Covid) Nasopharyngeal Swab     Status: None   Collection Time: 05/31/21  7:12 PM   Specimen: Nasopharyngeal Swab; Nasopharyngeal(NP) swabs in vial transport medium  Result Value Ref Range Status   SARS Coronavirus 2 by RT PCR NEGATIVE NEGATIVE Final     Comment: (NOTE) SARS-CoV-2 target nucleic acids are NOT DETECTED.  The SARS-CoV-2 RNA is generally detectable in upper respiratory specimens during the acute phase of infection. The lowest concentration of SARS-CoV-2 viral copies this assay can detect is 138 copies/mL. A negative result does not preclude SARS-Cov-2 infection and should not be used as the sole basis for treatment or other patient management decisions. A negative result may occur with  improper specimen collection/handling, submission of specimen other than nasopharyngeal swab, presence of viral mutation(s) within the areas targeted by this assay, and inadequate number of viral copies(<138 copies/mL). A negative result must be combined with clinical observations, patient history, and epidemiological information. The expected result is Negative.  Fact Sheet for Patients:  EntrepreneurPulse.com.au  Fact Sheet for Healthcare Providers:  IncredibleEmployment.be  This test is no t yet approved or cleared by the [place] FDA and  has been authorized for detection and/or diagnosis of SARS-CoV-2 by FDA under an Emergency Use Authorization (EUA). This EUA will remain  in effect (meaning this test can be used) for the duration of the COVID-19 declaration under Section 564(b)(1) of the Act, 21 U.S.C.section 360bbb-3(b)(1), unless the authorization is terminated  or revoked sooner.       Influenza A by PCR NEGATIVE NEGATIVE Final   Influenza B by PCR NEGATIVE NEGATIVE Final    Comment: (NOTE) The Xpert Xpress SARS-CoV-2/FLU/RSV plus assay is intended as an aid in the diagnosis of influenza from Nasopharyngeal swab specimens and should not be used as a sole basis for treatment. Nasal washings and aspirates are unacceptable for Xpert Xpress SARS-CoV-2/FLU/RSV testing.  Fact Sheet for Patients: EntrepreneurPulse.com.au  Fact Sheet for Healthcare  Providers: IncredibleEmployment.be  This test is not yet approved or cleared by the [place]  States FDA and has been authorized for detection and/or diagnosis of SARS-CoV-2 by FDA under an Emergency Use Authorization (EUA). This EUA will remain in effect (meaning this test can be used) for the duration of the COVID-19 declaration under Section 564(b)(1) of the Act, 21 U.S.C. section 360bbb-3(b)(1), unless the authorization is terminated or revoked.  Performed at [facility], [place] [address] [place], [place] [address]   Urine Culture     Status: Abnormal   Collection Time: 06/01/21  2:42 AM   Specimen: In/Out Cath Urine  Result Value Ref Range Status   Specimen Description IN/OUT CATH URINE  Final   Special Requests   Final    NONE Performed at [facility], [place] [address], [place], [place] [address]    Culture MULTIPLE SPECIES PRESENT, SUGGEST RECOLLECTION (A)  Final   Report Status 06/02/2021 FINAL  Final      Radiology Studies: MR ANGIO HEAD WO CONTRAST  Result Date: 06/02/2021 CLINICAL DATA:  Neuro deficit, acute, stroke suspected EXAM: MRA HEAD WITHOUT CONTRAST TECHNIQUE: Angiographic images of the Circle of Willis were acquired using MRA technique without intravenous contrast. COMPARISON:  No pertinent prior exam. FINDINGS: POSTERIOR CIRCULATION: --Vertebral arteries: Normal --Inferior cerebellar arteries: Normal. --Basilar artery: Normal. --Superior cerebellar arteries: Normal. --Posterior cerebral arteries: Normal. ANTERIOR CIRCULATION: --Intracranial internal carotid arteries: Normal. --Anterior cerebral arteries (ACA): Normal. --Middle cerebral arteries (MCA): Normal. ANATOMIC VARIANTS: Fetal origins of both posterior cerebral arteries. IMPRESSION: Normal intracranial MRA. Electronically Signed   By: Ulyses Jarred M.D.   On: 06/02/2021 22:53   IR US Guide Bx Asp/Drain  Result Date: 06/02/2021 INDICATION: 75 year old female with a known metastatic  disease to the liver, presents for biopsy EXAM: IR ULTRASOUND GUIDANCE MEDICATIONS: None. ANESTHESIA/SEDATION: Moderate (conscious) sedation was employed during this procedure. A total of Versed 1.5 mg and Fentanyl 70 mcg was administered intravenously. Moderate Sedation Time: 10 minutes. The patient's level of consciousness and vital signs were monitored continuously by radiology nursing throughout the procedure under my direct supervision. FLUOROSCOPY TIME:  Ultrasound COMPLICATIONS: None PROCEDURE: Informed written consent was obtained from the patient's family after a thorough discussion of the procedural risks, benefits and alternatives. All questions were addressed. Maximal Sterile Barrier Technique was utilized including caps, mask, sterile gowns, sterile gloves, sterile drape, hand hygiene and skin antiseptic. A timeout was performed prior to the initiation of the procedure. Ultrasound survey of the right liver lobe performed with images stored and sent to PACs. The right lower thorax/right upper abdomen was prepped with chlorhexidine in a sterile fashion, and a sterile drape was applied covering the operative field. A sterile gown and sterile gloves were used for the procedure. Local anesthesia was provided with 1% Lidocaine. The patient was prepped and draped sterilely and the skin and subcutaneous tissues were generously infiltrated with 1% lidocaine. A 17 gauge introducer needle was then advanced under ultrasound guidance in an intercostal location into the right liver lobe, targeting heterogeneously hypoechoic mass of the right liver. The stylet was removed, and multiple separate 18 gauge core biopsy were retrieved. Samples were placed into formalin for transportation to the lab. Gel-Foam pledgets were then infused with a small amount of saline for assistance with hemostasis. The needle was removed, and a final ultrasound image was performed. The patient tolerated the procedure well and remained  hemodynamically stable throughout. No complications were encountered and no significant blood loss was encounter. IMPRESSION: Status post ultrasound-guided liver mass biopsy. Signed, Dulcy Fanny. Earleen Newport, DO, RPVI  [facility] Electronically Signed   By: Corrie Mckusick D.O.   On: 06/02/2021 14:06   ECHOCARDIOGRAM COMPLETE  Result Date: 06/02/2021    ECHOCARDIOGRAM REPORT   Patient Name:   FREDRICKA KOHRS Date of Exam: 06/02/2021 Medical Rec #:  [id number]           Height:       67.0 in Accession #:    [id number]          Weight:       130.0 lb Date of Birth:  20-Jan-1946            BSA:          1.684 m Patient Age:    50 years            BP:           115/50 mmHg Patient Gender: F                   HR:           78 bpm. Exam Location:  Inpatient Procedure: 2D Echo, Cardiac Doppler and Color Doppler Indications:    Pulmonary embolus  History:        Patient has no prior history of Echocardiogram examinations.                 COPD, Arrythmias:Atrial Fibrillation; Risk Factors:Hypertension                 and Dyslipidemia. H/O AAA s/p repair.  Sonographer:    Clayton Lefort RDCS (AE) Referring Phys: [place]  1. Left ventricular ejection fraction, by estimation, is 55 to 60%. The left ventricle has normal function. The left ventricle has no regional wall motion abnormalities. There is moderate concentric left ventricular hypertrophy. Left ventricular diastolic parameters were normal.  2. Right ventricular systolic function is normal. The right ventricular size is normal. There is normal pulmonary artery systolic pressure.  3. The mitral valve is normal in structure. No evidence of mitral valve regurgitation. No evidence of mitral stenosis.  4. The aortic valve is normal in structure. Aortic valve regurgitation is mild. No aortic stenosis is present. FINDINGS  Left Ventricle: Left ventricular ejection fraction, by estimation, is 55 to 60%. The left  ventricle has normal function. The left ventricle has no regional wall motion abnormalities. The left ventricular internal cavity size was normal in size. There is  moderate concentric left ventricular hypertrophy. Left ventricular diastolic parameters were normal. Right Ventricle: The right ventricular size is normal. Right vetricular wall thickness was not well visualized. Right ventricular systolic function is normal. There is normal pulmonary artery systolic pressure. The tricuspid regurgitant velocity is 2.35 m/s, and with an assumed right atrial pressure of 8 mmHg, the estimated right ventricular systolic pressure is 30.0 mmHg. Left Atrium: Left atrial size was normal in size. Right Atrium: Right atrial size was normal in size. Pericardium: There is no evidence of pericardial effusion. Mitral Valve: The mitral valve is normal in structure. No evidence of mitral valve regurgitation. No evidence of mitral valve stenosis. Tricuspid Valve: The tricuspid valve is grossly normal. Tricuspid valve regurgitation is trivial. Aortic Valve: The aortic valve is normal in structure. Aortic valve regurgitation is mild. No aortic stenosis is present. Aortic valve mean gradient measures 2.0 mmHg. Aortic valve peak gradient measures 4.2 mmHg. Aortic valve area, by VTI measures 2.95 cm. Pulmonic Valve: The pulmonic valve was grossly normal.  Pulmonic valve regurgitation is not visualized. Aorta: The aortic root and ascending aorta are structurally normal, with no evidence of dilitation. IAS/Shunts: The atrial septum is grossly normal.  LEFT VENTRICLE PLAX 2D LVIDd:         3.80 cm  Diastology LVIDs:         2.60 cm  LV e' medial:    10.60 cm/s LV PW:         1.40 cm  LV E/e' medial:  7.1 LV IVS:        1.50 cm  LV e' lateral:   11.10 cm/s LVOT diam:     2.20 cm  LV E/e' lateral: 6.7 LV SV:         65 LV SV Index:   39 LVOT Area:     3.80 cm  RIGHT VENTRICLE             IVC RV Basal diam:  2.70 cm     IVC diam: 0.90 cm RV S  prime:     11.20 cm/s TAPSE (M-mode): 1.9 cm LEFT ATRIUM             Index       RIGHT ATRIUM           Index LA diam:        3.50 cm 2.08 cm/m  RA Area:     11.40 cm LA Vol (A2C):   49.6 ml 29.45 ml/m RA Volume:   21.50 ml  12.77 ml/m LA Vol (A4C):   33.5 ml 19.89 ml/m LA Biplane Vol: 42.3 ml 25.12 ml/m  AORTIC VALVE AV Area (Vmax):    3.20 cm AV Area (Vmean):   2.95 cm AV Area (VTI):     2.95 cm AV Vmax:           103.00 cm/s AV Vmean:          73.500 cm/s AV VTI:            0.222 m AV Peak Grad:      4.2 mmHg AV Mean Grad:      2.0 mmHg LVOT Vmax:         86.80 cm/s LVOT Vmean:        57.000 cm/s LVOT VTI:          0.172 m LVOT/AV VTI ratio: 0.77  AORTA Ao Root diam: 3.70 cm MITRAL VALVE                TRICUSPID VALVE MV Area (PHT): 3.72 cm     TR Peak grad:   22.1 mmHg MV Decel Time: 204 msec     TR Vmax:        235.00 cm/s MV E velocity: 74.90 cm/s MV A velocity: 119.00 cm/s  SHUNTS MV E/A ratio:  0.63         Systemic VTI:  0.17 m                             Systemic Diam: 2.20 cm Mertie Moores MD Electronically signed by Mertie Moores MD Signature Date/Time: 06/02/2021/12:35:38 PM    Final    VAS US CAROTID  Result Date: 06/02/2021 Carotid Arterial Duplex Study Patient Name:  JETTIE MANNOR  Date of Exam:   06/02/2021 Medical Rec #: [id number]            Accession #:    [id number] Date of Birth: [birth year]  Patient Gender: F Patient Age:   28 years Exam Location:  [facility] Procedure:      VAS US CAROTID Referring Phys: Alferd Patee [facility] --------------------------------------------------------------------------------  Indications:  CVA. Risk Factors: Hypertension, hyperlipidemia, current smoker. Performing Technologist: Darlin Coco RDMS,RVT  Examination Guidelines: A complete evaluation includes B-mode imaging, spectral Doppler, color Doppler, and power Doppler as needed of all accessible portions of each vessel. Bilateral testing is considered an integral part of a complete  examination. Limited examinations for reoccurring indications may be performed as noted.  Right Carotid Findings: +----------+--------+--------+--------+---------------------+------------------+           PSV cm/sEDV cm/sStenosisPlaque Description   Comments           +----------+--------+--------+--------+---------------------+------------------+ CCA Prox  63      9                                    intimal thickening +----------+--------+--------+--------+---------------------+------------------+ CCA Distal58      8                                    intimal thickening +----------+--------+--------+--------+---------------------+------------------+ ICA Prox  48      10      1-39%   hyperechoic,                                                              heterogenous and                                                          irregular                               +----------+--------+--------+--------+---------------------+------------------+ ICA Distal83      18                                                      +----------+--------+--------+--------+---------------------+------------------+ ECA       81                                                              +----------+--------+--------+--------+---------------------+------------------+ +----------+--------+-------+----------------+-------------------+           PSV cm/sEDV cmsDescribe        Arm Pressure (mmHG) +----------+--------+-------+----------------+-------------------+ [id number]            Multiphasic, WNL                    +----------+--------+-------+----------------+-------------------+ +---------+--------+--+--------+--+---------+ VertebralPSV cm/s37EDV cm/s10Antegrade +---------+--------+--+--------+--+---------+  Left Carotid Findings: +----------+--------+--------+--------+--------------------+-------------------+  PSV cm/sEDV  cm/sStenosisPlaque Description  Comments            +----------+--------+--------+--------+--------------------+-------------------+ CCA Prox  90      9                                   intimal thickening  +----------+--------+--------+--------+--------------------+-------------------+ CCA Distal71      9                                   intimal thickening  +----------+--------+--------+--------+--------------------+-------------------+ ICA Prox  220     44      60-79%  hyperechoic,        Velocities may                                        heterogenous,       underestimate                                         irregular and       degree of stenosis                                    calcific            due to more                                                               proximal                                                                  obstruction.        +----------+--------+--------+--------+--------------------+-------------------+ ICA Mid   174     26                                                      +----------+--------+--------+--------+--------------------+-------------------+ ICA Distal81      22                                                      +----------+--------+--------+--------+--------------------+-------------------+ ECA       70                                                              +----------+--------+--------+--------+--------------------+-------------------+ +----------+--------+--------+--------+-------------------+  PSV cm/sEDV cm/sDescribeArm Pressure (mmHG) +----------+--------+--------+--------+-------------------+ Subclavian307             Stenotic                    +----------+--------+--------+--------+-------------------+ +---------+--------+--+--------+--+---------+ VertebralPSV cm/s46EDV cm/s11Antegrade  +---------+--------+--+--------+--+---------+   Summary: Right Carotid: Velocities in the right ICA are consistent with a 1-39% stenosis. Left Carotid: Velocities in the left ICA are consistent with a 60-79% stenosis               by ICA/CCA ratio. Vertebrals:  Bilateral vertebral arteries demonstrate antegrade flow. Subclavians: Left subclavian artery was stenotic. Normal flow hemodynamics were              seen in the right subclavian artery. *See table(s) above for measurements and observations.     Preliminary     Scheduled Meds:  atorvastatin  40 mg Oral Daily   diltiazem  180 mg Oral Daily   feeding supplement  237 mL Oral TID BM   fluticasone furoate-vilanterol  1 puff Inhalation Daily   metoprolol succinate  25 mg Oral Daily   multivitamin  15 mL Oral Daily   pantoprazole (PROTONIX) IV  40 mg Intravenous Q24H   rivastigmine  9.5 mg Transdermal Q24H   umeclidinium bromide  1 puff Inhalation Daily   Continuous Infusions:  aztreonam Stopped (06/04/21 0502)   heparin 1,400 Units/hr (06/04/21 0548)   metronidazole 100 mL/hr at 06/04/21 0548   vancomycin Stopped (06/04/21 0410)     LOS: 4 days   Time spent: 35 minutes.  Little Ishikawa, DO [facility] Pager: Secure chat  06/04/2021, 8:34 AM

## 2021-06-04 NOTE — Consult Note (Signed)
Consultation Note Date: 06/04/2021   Patient Name: Anne West  DOB: 17-Jul-1946  MRN: [id number]  Age / Sex: 75 y.o., female  PCP: Default, Provider, MD Referring Physician: Little Ishikawa, MD  Reason for Consultation: Establishing goals of care and Pain control  HPI/Patient Profile: 75 y.o. female  with past medical history of dementia, AAA repair, A. fib not on anticoagulation, COPD, hypertension, hyperlipidemia admitted on 05/31/2021 with confusion, slurred speech, lower abdominal pain, multiple falls, not eating or drinking and having difficulty swallowing.   Patient was admitted to [facility] about 2 weeks ago for abdominal pain secondary to constipation in the setting of opiate use, poor oral intake, and significant weight loss.  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 Palliative medicine has been consulted to assist with pain management and goals of care conversations.  Clinical Assessment and Goals of Care:  I have reviewed medical records including EPIC notes, labs and imaging, received report from RN, assessed the patient and then met at the bedside along with patient's daughter Olivia Mackie to discuss diagnosis, prognosis, GOC, EOL wishes, disposition and options.  I introduced Palliative Medicine as specialized medical care for people living with serious illness. It focuses on providing relief from the symptoms and stress of a serious illness. The goal is to improve quality of life for both the patient and the family.  We discussed a brief life review of the patient and then focused on their current illness. The natural disease trajectory and expectations at EOL were discussed. Rashan has 3 daughters and multiple grandchildren/great-grandchildren. She previously enjoyed reading, fishing, and camping but has not been able to do these activities in several  years. Olivia Mackie shares that Mylah has been declining for several years, although her decline has become even more rapid over the past 3 months. This began with her worsening memory, at which time she moved from her apartment to another daughter's home for additional support. 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 Patient has a cane but does not like to use it. She has very little nutritional intake (states that everything tastes "nasty") which has resulted in significant weight loss over the past several weeks.   Educated on the terminal nature of dementia and signs/symptoms indicative of progression to end stage disease. Discussed that patient would be unlikely to tolerate treatments for cancer such as chemo/radiation. Shared my concern that patient's strokes are also complicating her current illness and patient is at risk for more complications and poor quality of life. Reviewed patient's pain control regimen and educated on the importance of balancing effective treatment with goals of care.  I attempted to elicit values and goals of care important to the patient.   Olivia Mackie shares that the patient has previously expressed a strong desire to avoid nursing home placement. Olivia Mackie comments that family will attempt to  honor this wish as long as they are capable, but she also understands that this may need to be reconsidered if medical needs or safety cannot be met. Patient has stated that she wishes to be cremated at end of life. She has never discussed her preferences, thoughts on treatment options, or hospice with her daughters. Olivia Mackie does not want Terese to suffer or be in pain, but she would like to discuss options further with her sisters before making firm decisions.    The difference between aggressive medical intervention and comfort care was considered  in light of the patient's goals of care.   Advanced directives, concepts specific to code status, artifical feeding and hydration, and rehospitalization were considered and discussed.  Hospice and Palliative Care services outpatient were explained and offered.  Discussed the importance of continued conversation with family and the medical providers regarding overall plan of care and treatment options, ensuring decisions are within the context of the patient's values and GOCs.    Questions and concerns were addressed. The family was encouraged to call with questions or concerns.  PMT will continue to support holistically.    NEXT OF KIN are majority of patient's 3 adult daughters. No HCPOA on file.    SUMMARY OF RECOMMENDATIONS   -Continue full scope treatment for now; family will coordinate availability of daughters and call PMT for a follow up goc meeting -Full code for now; Patient's daughter Olivia Mackie understands that CPR would be more harmful than beneficial but would like to discuss further as a family -Discontinue Norco and start Roxanol oral solution Q4H PRN -Psychosocial and emotional support provided -Ongoing support from [place]: Full code  Symptom Management:  As above  Palliative Prophylaxis:  Bowel Regimen, Delirium Protocol, and Frequent Pain Assessment  Additional Recommendations (Limitations, Scope, Preferences): Full Scope Treatment  Psycho-social/Spiritual:  Desire for further Chaplaincy support:TBD Additional Recommendations: Caregiving  Support/Resources, Education on Hospice, and Referral to [employer]   Prognosis:  Poor long-term prognosis given progression of dementia, strokes, functional/cognitive/nutritional decline, possible cancer  Discharge Planning: To Be Determined      Primary Diagnoses: Present on Admission:  Acute pulmonary embolism ([place])   I have reviewed the medical record, interviewed the patient  and family, and examined the patient. The following aspects are pertinent.  Past Medical History:  Diagnosis Date   A-fib (HCC)    COPD (chronic obstructive pulmonary disease) (HCC)    High cholesterol    Hypertension    Social History   Socioeconomic History   Marital status: Divorced    Spouse name: Not on file   Number of children: Not on file   Years of education: Not on file   Highest education level: Not on file  Occupational History   Not on file  Tobacco Use   Smoking status: Every Day    Types: Cigarettes   Smokeless tobacco: Never  Substance and Sexual Activity   Alcohol use: No   Drug use: No   Sexual activity: Not on file  Other Topics Concern   Not on file  Social History Narrative   Not on file   Social Determinants of Health   Financial Resource Strain: Not on file  Food Insecurity: Not on file  Transportation Needs: Not on file  Physical Activity: Not on file  Stress: Not on file  Social Connections: Not on file   History reviewed. No pertinent family history. Scheduled Meds:  atorvastatin  40 mg Oral Daily  diltiazem  180 mg Oral Daily   feeding supplement  237 mL Oral TID BM   fluticasone furoate-vilanterol  1 puff Inhalation Daily   metoprolol succinate  25 mg Oral Daily   multivitamin  15 mL Oral Daily   pantoprazole (PROTONIX) IV  40 mg Intravenous Q24H   rivastigmine  9.5 mg Transdermal Q24H   umeclidinium bromide  1 puff Inhalation Daily   Continuous Infusions:  aztreonam Stopped (06/04/21 0502)   heparin 1,400 Units/hr (06/04/21 0548)   metronidazole 100 mL/hr at 06/04/21 0548   vancomycin Stopped (06/04/21 0410)   PRN Meds:.HYDROcodone-acetaminophen, ibuprofen, morphine injection, polyethylene glycol, zolpidem Medications Prior to Admission:  Prior to Admission medications   Medication Sig Start Date End Date Taking? Authorizing Provider  albuterol (PROVENTIL HFA;VENTOLIN HFA) 108 (90 BASE) MCG/ACT inhaler Inhale 2 puffs into  the lungs every 6 (six) hours as needed for wheezing.   Yes [provider]  amLODipine (NORVASC) 5 MG tablet Take 5 mg by mouth daily.   Yes [provider]  aspirin EC 81 MG tablet Take 81 mg by mouth daily. Swallow whole.   Yes [provider]  atorvastatin (LIPITOR) 40 MG tablet Take 40 mg by mouth daily.   Yes [provider]  Cholecalciferol 25 MCG (1000 UT) capsule Take 1,000 Units by mouth daily.   Yes [provider]  DILT-XR 180 MG 24 hr capsule Take 180 mg by mouth daily. 05/29/21  Yes [provider]  fluticasone (FLONASE) 50 MCG/ACT nasal spray Place 1 spray into the nose daily as needed for allergies or rhinitis.   Yes [provider]  gabapentin (NEURONTIN) 800 MG tablet Take 800 mg by mouth in the morning and at bedtime. 04/22/21  Yes [provider]  HYDROcodone-acetaminophen (NORCO) 7.5-325 MG tablet Take 1 tablet by mouth 2 (two) times daily as needed for moderate pain. 11/04/17  Yes [provider]  ibuprofen (ADVIL,MOTRIN) 200 MG tablet Take 400 mg by mouth every 6 (six) hours as needed for headache or moderate pain. For fever and pain   Yes [provider]  Magnesium 250 MG TABS Take 250 mg by mouth daily.   Yes [provider]  metoprolol succinate (TOPROL-XL) 25 MG 24 hr tablet Take 25 mg by mouth daily. 04/18/21  Yes [provider]  polyethylene glycol powder (GLYCOLAX/MIRALAX) 17 GM/SCOOP powder Take 17 g by mouth daily as needed. 05/17/21  Yes [provider]  POTASSIUM PO Take 1 tablet by mouth daily. OTC   Yes [provider]  rivastigmine (EXELON) 4.5 MG capsule Take 4.5 mg by mouth 2 (two) times daily. 04/22/21  Yes [provider]  TRELEGY ELLIPTA 100-62.5-25 MCG/INH AEPB Take 1 puff by mouth daily. 05/11/21  Yes [provider]  zolpidem (AMBIEN) 5 MG tablet Take 5 mg by mouth at bedtime. 04/20/21  Yes [provider]   hydrOXYzine (ATARAX/VISTARIL) 25 MG tablet Take 1 tablet (25 mg total) by mouth every 6 (six) hours as needed for itching. Patient not taking: No sig reported 07/26/16   Tanna Furry, MD   Allergies  Allergen Reactions   Donepezil Itching    SEVERE ITCHING   Penicillins Cross Reactors     Unknown    Sulfa Antibiotics    Review of Systems  Unable to perform ROS: Dementia  Gastrointestinal:  Positive for abdominal pain.   Physical Exam Vitals and nursing note reviewed.  Constitutional:      General: She is awake.  Comments: Elderly F in NAD  Cardiovascular:     Rate and Rhythm: Normal rate.  Pulmonary:     Effort: Pulmonary effort is normal. No respiratory distress.  Neurological:     Mental Status: She is alert. She is disoriented and confused.  Psychiatric:        Cognition and Memory: Cognition is impaired.     Comments: Restless    Vital Signs: BP (!) 143/74 (BP Location: Right Arm)   Pulse 95   Temp 99.3 F (37.4 C) (Oral)   Resp 20   Ht _0  (1.702 m)   Wt 59 kg   SpO2 98%   BMI 20.36 kg/m  Pain Scale: 0-10 POSS *See Group Information*: 1-Acceptable,Awake and alert Pain Score: 4    SpO2: SpO2: 98 % O2 Device:SpO2: 98 % O2 Flow Rate: .O2 Flow Rate (L/min): 2 L/min  IO: Intake/output summary:  Intake/Output Summary (Last 24 hours) at 06/04/2021 1004 Last data filed at 06/04/2021 0413 Gross per 24 hour  Intake 606.37 ml  Output --  Net 606.37 ml    LBM: Last BM Date: 06/02/21 Baseline Weight: Weight: 59 kg Most recent weight: Weight: 59 kg     Palliative Assessment/Data: 20-30%     Time In: 8:30am Time Out: 9:40am Time Total: 70 minutes Greater than 50% of this time was spent in counseling and coordinating care related to the above assessment and plan.  Dorthy Cooler, PA-C Palliative Medicine Team Team phone # [phone]  Thank you for allowing the Palliative Medicine Team to assist in the care of this patient. Please utilize secure  chat with additional questions, if there is no response within 30 minutes please call the above phone number.  Palliative Medicine Team providers are available by phone from 7am to 7pm daily and can be reached through the team cell phone.  Should this patient require assistance outside of these hours, please call the patient's attending physician.

## 2021-06-04 NOTE — Progress Notes (Signed)
ANTICOAGULATION CONSULT NOTE - Follow Up Consult  Pharmacy Consult for heparin Indication: pulmonary embolus  Labs: Recent Labs    06/02/21 0233 06/02/21 2242 06/03/21 0014 06/03/21 0724 06/03/21 0733 06/03/21 1541 06/04/21 0152  HGB 9.3*  --  8.1*  --   --   --  7.9*  HCT 27.9*  --  24.2*  --   --   --  23.7*  PLT 147*  --  131*  --   --   --  174  HEPARINUNFRC  --    < >  --  0.37  --  0.42 0.52  CREATININE  --   --   --   --  0.52 0.51 0.53   < > = values in this interval not displayed.     Assessment: Anne West is a 76 YOF who presented with confusion, lower abdomina pain, multiple falls in the last week, slurred speech and having issues swallowing (no eating or drinking). On chest CT, found small segmental pulmonary emboli within the right upper, middle and lower lobes and upper left lobe. She is s/p endoscopy with no intervention and had liver biopsy on 8/05. Pharmacy consulted to dose heparin.   Heparin level 0.52 and therapeutic. Hgb decreased slightly (9.3 > 8.1 > 7.9) which may be likely to poor PO intake, plt are WNL. No signs/symptoms of bleeding and no IV site issues per RN.   Currently, pt with poor PO intake per nurse. Last night, no PO meds given. Will likely hold off on switching to PO anticoagulant in the setting of poor PO intake and slight decrease in Hgb.   Goal of Therapy:  Heparin level 0.3-0.7 units/ml   Plan:  Continue IV heparin gtt at 1400 units/h Daily heparin level, CBC Monitor s/sx of bleeding Will f/u with PO AC plan   Thank you for involving pharmacy in this patient's care.  Elita Quick, PharmD PGY1 Ambulatory Care Pharmacy Resident 06/04/2021 9:20 AM  **Pharmacist phone directory can be found on [email or web address] listed under [place]**

## 2021-06-05 DIAGNOSIS — I6340 Cerebral infarction due to embolism of unspecified cerebral artery: Secondary | ICD-10-CM

## 2021-06-05 DIAGNOSIS — R7989 Other specified abnormal findings of blood chemistry: Secondary | ICD-10-CM

## 2021-06-05 LAB — CBC
HCT: 23.8 % — ABNORMAL LOW (ref 36.0–46.0)
Hemoglobin: 8 g/dL — ABNORMAL LOW (ref 12.0–15.0)
MCH: 29.1 pg (ref 26.0–34.0)
MCHC: 33.6 g/dL (ref 30.0–36.0)
MCV: 86.5 fL (ref 80.0–100.0)
Platelets: 139 10*3/uL — ABNORMAL LOW (ref 150–400)
RBC: 2.75 MIL/uL — ABNORMAL LOW (ref 3.87–5.11)
RDW: 19.6 % — ABNORMAL HIGH (ref 11.5–15.5)
WBC: 17.3 10*3/uL — ABNORMAL HIGH (ref 4.0–10.5)
nRBC: 0 % (ref 0.0–0.2)

## 2021-06-05 LAB — BASIC METABOLIC PANEL
Anion gap: 7 (ref 5–15)
BUN: 7 mg/dL — ABNORMAL LOW (ref 8–23)
CO2: 26 mmol/L (ref 22–32)
Calcium: 7.8 mg/dL — ABNORMAL LOW (ref 8.9–10.3)
Chloride: 105 mmol/L (ref 98–111)
Creatinine, Ser: 0.5 mg/dL (ref 0.44–1.00)
GFR, Estimated: >60 mL/min (ref >60)
Glucose, Bld: 108 mg/dL — ABNORMAL HIGH (ref 70–99)
Potassium: 3.4 mmol/L — ABNORMAL LOW (ref 3.5–5.1)
Sodium: 138 mmol/L (ref 135–145)

## 2021-06-05 LAB — HEPARIN LEVEL (UNFRACTIONATED): Heparin Unfractionated: 0.49 IU/mL (ref 0.30–0.70)

## 2021-06-05 LAB — SURGICAL PATHOLOGY

## 2021-06-05 LAB — MAGNESIUM: Magnesium: 1.9 mg/dL (ref 1.7–2.4)

## 2021-06-05 MED ORDER — MORPHINE SULFATE (CONCENTRATE) 10 MG/0.5ML PO SOLN
5.0000 mg | ORAL | Status: DC | PRN
  Administered 2021-06-05 – 2021-06-06 (×3): 10 mg via SUBLINGUAL
  Filled 2021-06-05 (×3): qty 0.5

## 2021-06-05 NOTE — Progress Notes (Signed)
Physical Therapy Treatment Patient Details Name: Anne West MRN: [id number] DOB: 01-08-1946 Today's Date: 06/05/2021    History of Present Illness 75 y.o. female presents to [facility] ED 8/3 with reports of confusion, slurred speech, abdominal pain, falls, and dysphagia. Pt recently diagnosed with liver mets with unknown primary malignancy. Pt found to have acute subsegmental PE. MRI 8/4 shows Multifocal bilateral acute or early subacute ischemia within the  MCA territories. Pt underwent upper GI endoscopy on 8/5. PMH includes dementia, AAA repair, A. fib not on anticoagulation, COPD, hypertension, hyperlipidemia, dementia.    PT Comments    Pt's daughter Joelene Millin present and assisted with ambulation. Pt able to amb in hallway with 2 person assist. Incr assist needed due to pt trying to sit in the middle of the hall due to her dementia. Without daughter's assistance likely would not have gotten very far with ambulation. Feel pt will need a w/c in addition to the other equipment she has at home.    Follow Up Recommendations  Home health PT;Supervision/Assistance - 24 hour     Equipment Recommendations  Wheelchair (measurements PT)    Recommendations for Other Services       Precautions / Restrictions Precautions Precautions: Fall Restrictions Weight Bearing Restrictions: No    Mobility  Bed Mobility Overal bed mobility: Needs Assistance Bed Mobility: Supine to Sit     Supine to sit: Max assist;HOB elevated     General bed mobility comments: Assist to bring legs off of bed and elevate trunk into sitting. Assist primarily due to lack of initiation of movement.    Transfers Overall transfer level: Needs assistance Equipment used: 2 person hand held assist;4-wheeled walker Transfers: Sit to/from [employer] Transfers Sit to Stand: +2 physical assistance;Mod assist Stand pivot transfers: +2 safety/equipment;Min assist       General transfer comment: Assist to bring  hips up and for balance. Pt not initiating movement  Ambulation/Gait Ambulation/Gait assistance: +2 physical assistance;Min assist;Mod assist Gait Distance (Feet): 100 Feet Assistive device: 4-wheeled walker Gait Pattern/deviations: Step-through pattern;Decreased step length - right;Decreased step length - left;Trunk flexed Gait velocity: decr Gait velocity interpretation: <1.31 ft/sec, indicative of household ambulator General Gait Details: Pt's daughter present and assisted with mobility. At times pt upright and needing only min assist to amb. At other times pt trying to repeatedly sit down  and required +2 mod assist. The attempts to sit down in the floor seemed to be related to the dementia vs weakness. Without daughter present telling pt to continue pt likely wouldn't have ambulated with [place].   Stairs             Wheelchair Mobility    Modified Rankin (Stroke Patients Only)       Balance Overall balance assessment: Needs assistance Sitting-balance support: No upper extremity supported;Feet supported Sitting balance-Leahy Scale: Fair     Standing balance support: Bilateral upper extremity supported Standing balance-Leahy Scale: Poor Standing balance comment: rollator and +1/2 min to mod for static standing                            Cognition Arousal/Alertness: Awake/alert Behavior During Therapy: Flat affect;Impulsive Overall Cognitive Status: History of cognitive impairments - at baseline Area of Impairment: Following commands;Problem solving;Safety/judgement;Attention                   Current Attention Level: Sustained   Following Commands: Follows one step commands inconsistently;Follows one step commands with  increased time Safety/Judgement: Decreased awareness of safety;Decreased awareness of deficits   Problem Solving: Slow processing;Decreased initiation;Requires verbal cues;Requires tactile cues General Comments: Pt has h/o dementia       Exercises      General Comments General comments (skin integrity, edema, etc.): VSS on RA      Pertinent Vitals/Pain Pain Assessment: Faces Faces Pain Scale: No hurt    Home Living                      Prior Function            PT Goals (current goals can now be found in the care plan section) Progress towards PT goals: Progressing toward goals    Frequency    Min 4X/week      PT Plan Current plan remains appropriate;Equipment recommendations need to be updated    Co-evaluation              AM-PAC PT "6 Clicks" Mobility   Outcome Measure  Help needed turning from your back to your side while in a flat bed without using bedrails?: A Lot Help needed moving from lying on your back to sitting on the side of a flat bed without using bedrails?: A Lot Help needed moving to and from a bed to a chair (including a wheelchair)?: Total Help needed standing up from a chair using your arms (e.g., wheelchair or bedside chair)?: Total Help needed to walk in hospital room?: Total Help needed climbing 3-5 steps with a railing? : Total 6 Click Score: 8    End of Session Equipment Utilized During Treatment: Gait belt Activity Tolerance: Patient limited by fatigue Patient left: in chair;with call bell/phone within reach;with chair alarm set;with family/visitor present Nurse Communication: Mobility status PT Visit Diagnosis: Unsteadiness on feet (R26.81);Muscle weakness (generalized) (M62.81);Other symptoms and signs involving the nervous system [id number])     Time: [id number] PT Time Calculation (min) (ACUTE ONLY): 30 min  Charges:  $Gait Training: 23-37 mins                     [place] Pager [phone] Office [place] 06/05/2021, 12:38 PM

## 2021-06-05 NOTE — Progress Notes (Signed)
ANTICOAGULATION CONSULT NOTE - Follow Up Consult  Pharmacy Consult for heparin Indication: pulmonary embolus  Labs: Recent Labs    06/03/21 0014 06/03/21 0724 06/03/21 1541 06/04/21 0152 06/05/21 0704  HGB 8.1*  --   --  7.9* 8.0*  HCT 24.2*  --   --  23.7* 23.8*  PLT 131*  --   --  174 139*  HEPARINUNFRC  --    < > 0.42 0.52 0.49  CREATININE  --    < > 0.51 0.53 0.50   < > = values in this interval not displayed.     Assessment: Ms. Stepaniak is a 32 YOF who presented with confusion, lower abdomina pain, multiple falls in the last week, slurred speech and having issues swallowing (no eating or drinking). On chest CT, found small segmental pulmonary emboli within the right upper, middle and lower lobes and upper left lobe. She is s/p endoscopy with no intervention and had liver biopsy on 8/05. She is also noted with bilateral MCA stroke. Pharmacy consulted to dose heparin.   Heparin level 0.49 on 1400 units/hr. Hg= 8 (low/stable)  Goal of Therapy:  Heparin level= 0.3-0.5   Plan:  Continue IV heparin gtt at 1400 units/h Daily heparin level, CBC Will f/u with PO AC plan   Thank you for involving pharmacy in this patient's care.  Hildred Laser, PharmD Clinical Pharmacist **Pharmacist phone directory can now be found on [email or web address] ([email or web address]).  Listed under [place].

## 2021-06-05 NOTE — Progress Notes (Signed)
PROGRESS NOTE  Anne West  YQM:[id number] DOB: 11-12-1945 DOA: 05/31/2021 PCP: Default, Provider, MD   Brief Narrative: Anne West is a 75 y.o. female with a history of AFib not on anticoagulation, COPD, HTN, HLD, cognitive impairment, AAA s/p repair, and recent hospitalization for abdominal pain thought to be due to constipation where she was found to have numerous liver lesions and thickened gallbladder without other evidence of cholecystitis. She returned home and has continued to eat poorly, grow more confused than baseline and have several falls. Due to this and some slurred speech, her daughter brought her to the ED where she was febrile at 100.266F, tachycardic (sinus), without hypotension or hypoxia. WBC 14.4k, hgb 10.4g/dl, platelets 103k. SCr 0.7. AST 133, ALT 145, alk phos 814, TBili 2.5. Covid, flue PCR negative. Vast imaging evaluation revealed hepatic lesions, stable gallbladder wall thickening without cholelithiasis or pericholecystic fluid to suggest cholecystitis, and small bilateral pulmonary emboli.   Blood cultures were drawn, broad IV antibiotics started for SIRS (NOS), IV heparin was started for PE. GI was consulted for dysphagia, performed EGD 06/02/2021 showing non-bleeding gastroduodenitis, which was biopsied, without evidence of malignancy (full report below). IR is consulted and will perform U/S-guided biopsy of liver lesion. Oncology has made recommendations to complete initial work up and will follow up with the patient after pathology results have returned.  Assessment & Plan: Principal Problem:   Multiple subsegmental pulmonary emboli without acute cor pulmonale (HCC) Active Problems:   Sepsis (HCC)   Acute metabolic encephalopathy   Elevated liver enzymes   Anemia   Metastases to the liver (HCC)   Dysphagia   Abnormal CT of the abdomen   Protein-calorie malnutrition, severe   Cerebral embolism with cerebral infarction   Goals of care,  counseling/discussion   Goals of care discussion  -Lengthy discussion at bedside daily with family. -We will continue to discuss patient's poor p.o. intake will likely be the most influential factor in her prognosis, we discussed that with metastatic disease she likely has a poor prognosis but if she continues to refuse p.o. intake her clinical status will deteriorate much more quickly over the next week rather than weeks to months presuming advanced cancer based on imaging and work-up thus far -Appreciate palliative care consult and further discussion with family today, appears family is requesting continuation of full CODE STATUS despite discussion that if patient were to have an acute event requiring CPR or intubation this would likely only be more harmful than beneficial given the patient's prognosis. -Patient has completed 5 days of antibiotics as below we will discontinue, given her lack of improvement thus far on 5 days of broad-spectrum antibiotics her lingering mental status changes are likely more related to her advanced cancer and poor p.o. intake and malnutrition.  Bilateral MCA CVAs, acute cerebellar CVA: Distribution consistent with embolic showering. - Neurology consulted - now signed off - appreciate recs - Echo unremarkable - HDL low - lipid panel otherwise unremarkable; A1C WNL - Known history of atrial fibrillation.  - PT, OT, SLP ordered - Continue increased statin, IV heparin - transition to PO once patient more reasonably able to take PO and comply with PO medication administration  SIRS: WITHOUT source, does not meet sepsis criteria - Tmax is 100.66F at intake - afebrile since - fever in the setting of cancer/blood clot is not uncommon - Continue empiric abx, especially as we consider biopsy and await culture result - procalcitonin elevated at 1.5  - Continue to follow for  source - Urine culture appears poor/dirty collection; Blood cultures negative thus far -Antibiotics  discontinued 06/05/2021  Acute pulmonary emboli: Provoked by malignancy. Not hemodynamically compromising or causing hypoxia.  - Continue IV heparin which can be paused if needed for further biopsy or procedure - Check echocardiogram  Metastatic cancer, unknown primary: Numerous hepatic lesions on CT.  - IR biopsy planned 8/5.  - CA-125 4,263 and CEA 1,488 grossly elevated. - Oncology, Dr. Chryl Heck, consulted and discussed with the family by phone the plan for expedited oncology clinic follow up once biopsy results are back.  Dementia with acute delirium/altered mental status from baseline:  - Has known history of episodes of delirium at previous hospitalizations - Attempting to avoid narcotics given patient's mental status and high risk for delirium in the setting of dementia - Continue exelon  Falls, increasing confusion, slurred speech:  - Likely multifactorial given above - Delirium precautions.   - PT/OT  Unintentional weight loss, poor per oral intake:  - Likely related to metastatic cancer, no anatomical explanation found on EGD.  - Dietitian consult  Gastroduodenitis:  - Continue PPI, advance diet as tolerated per GI who previously signed off.   HLD: LFT elevation noted as above, likely due to metastases. CK wnl.  - Check lipid panel, start high-intensity statin (atorvastatin 72m > 860m  COPD:  - Prn BDs. Continue trelegy    HTN:  - Hold norvasc 55m555mbut continue AV nodal agents due to PAF but permissive HTN otherwise.  PAF: Has remained in sinus rhythm (ST > NSR).  - Initiating AC as above.  - Continue home diltiazem, metoprolol 255m44m.  Thrombocytopenia: Stable/improving - Continue monitoring level and for bleeding.   Normocytic anemia: - Likelyy due to metastatic disease. B12 sufficient, ferritin grossly elevated with low TIBC - Monitor telemetry  DVT prophylaxis: IV heparin (holding periprocedurally) Code Status: Full Family Communication: Grandson at  bedside Disposition Plan:  Status is: Inpatient  Remains inpatient appropriate because:Altered mental status, Ongoing diagnostic testing needed not appropriate for outpatient work up, and Unsafe d/c plan  Dispo: The patient is from: Home              Anticipated d/c is to:  TBD  - PT/OT pending.              Patient currently is not medically stable to d/c.   Difficult to place patient No  Consultants:  GI Oncology IR  Procedures:  EGD 06/02/2021 Dr. PyrtHilarie Fredricksonpression:        - Normal esophagus.                           - Gastroduodenitis without bleeding. Biopsied.                           - No upper GI source of malignancy Antimicrobials: Vancomycin, aztreonam, flagyl   Subjective: No acute issues or events overnight, patient resting comfortably this morning, continues to complain of diffuse pain - still refusing PO medication from staff (including pain meds on occasion)  Objective: Vitals:   06/04/21 2014 06/05/21 0010 06/05/21 0506 06/05/21 0759  BP: (!) 107/47 (!) 129/59 (!) 103/44 (!) 118/41  Pulse: 78 80 75 68  Resp: 18 20 19 18   Temp: 98.9 F (37.2 C) 98.6 F (37 C) (!) 97.4 F (36.3 C) 98.5 F (36.9 C)  TempSrc: Oral Oral Oral Oral  SpO2: 99% 95%  95%  Weight:      Height:        Intake/Output Summary (Last 24 hours) at 06/05/2021 0824 Last data filed at 06/05/2021 0513 Gross per 24 hour  Intake 560.07 ml  Output --  Net 560.07 ml    Filed Weights   05/31/21 2100  Weight: 59 kg   Gen: 75 y.o. female in no distress Pulm: Nonlabored breathing room air. Clear bilaterally. CV: Regular rate and rhythm. No JVD, no pitting dependent edema. GI: Abdomen nondistended Ext: Warm, no deformities Skin: No new rashes, lesions or ulcers on visualized skin. Neuro: Awake, alert, oriented to person and family only Psych: Judgement and insight appear impaired.  Data Reviewed: I have personally reviewed following labs and imaging studies  CBC: Recent Labs  Lab  05/31/21 1711 06/01/21 0556 06/02/21 0233 06/03/21 0014 06/04/21 0152 06/05/21 0704  WBC 14.4* 13.6* 11.8* 16.3* 18.3* 17.3*  NEUTROABS 10.3*  --   --   --   --   --   HGB 10.4* 9.5* 9.3* 8.1* 7.9* 8.0*  HCT 31.2* 30.4* 27.9* 24.2* 23.7* 23.8*  MCV 86.0 91.3 85.1 84.9 86.5 86.5  PLT 103* 112* 147* 131* 174 139*    Basic Metabolic Panel: Recent Labs  Lab 05/31/21 1711 06/01/21 0556 06/03/21 0733 06/03/21 1541 06/04/21 0152  NA 134* 134* 135 134* 137  K 3.7 3.3* 2.8* 3.4* 3.1*  CL 92* 98 101 98 102  CO2 28 23 24 22 28   GLUCOSE 102* 111* 97 90 99  BUN 21 17 11 10 9   CREATININE 0.71 0.53 0.52 0.51 0.53  CALCIUM 8.8* 8.2* 7.8* 8.0* 8.1*  MG  --   --   --   --  1.7    GFR: Estimated Creatinine Clearance: 54.9 mL/min (by C-G formula based on SCr of 0.53 mg/dL). Liver Function Tests: Recent Labs  Lab 05/31/21 1711 06/01/21 0556  AST 133* 110*  ALT 45* 41  ALKPHOS 814* 565*  BILITOT 2.5* 2.6*  PROT 6.9 6.0*  ALBUMIN 2.6* 2.3*    No results for input(s): LIPASE, AMYLASE in the last 168 hours. Recent Labs  Lab 06/01/21 1605  AMMONIA 51*    Coagulation Profile: Recent Labs  Lab 05/31/21 1711  INR 1.5*    Cardiac Enzymes: Recent Labs  Lab 05/31/21 1803  CKTOTAL 81    BNP (last 3 results) No results for input(s): PROBNP in the last 8760 hours. HbA1C: Recent Labs    06/03/21 0014  HGBA1C 5.1    CBG: No results for input(s): GLUCAP in the last 168 hours. Lipid Profile: Recent Labs    06/03/21 0014  CHOL 93  HDL 15*  LDLCALC 61  TRIG 85  CHOLHDL 6.2    Thyroid Function Tests: No results for input(s): TSH, T4TOTAL, FREET4, T3FREE, THYROIDAB in the last 72 hours.  Anemia Panel: No results for input(s): VITAMINB12, FOLATE, FERRITIN, TIBC, IRON, RETICCTPCT in the last 72 hours.  Urine analysis:    Component Value Date/Time   COLORURINE YELLOW 06/01/2021 0224   APPEARANCEUR HAZY (A) 06/01/2021 0224   LABSPEC 1.043 (H) 06/01/2021 0224    PHURINE 5.0 06/01/2021 0224   GLUCOSEU NEGATIVE 06/01/2021 0224   HGBUR MODERATE (A) 06/01/2021 0224   BILIRUBINUR NEGATIVE 06/01/2021 0224   KETONESUR 5 (A) 06/01/2021 0224   PROTEINUR 30 (A) 06/01/2021 0224   UROBILINOGEN 1.0 09/17/2011 1948   NITRITE NEGATIVE 06/01/2021 0224   LEUKOCYTESUR NEGATIVE 06/01/2021 0224   Recent Results (from the past 240  hour(s))  Blood Culture (routine x 2)     Status: None (Preliminary result)   Collection Time: 05/31/21  5:08 PM   Specimen: BLOOD  Result Value Ref Range Status   Specimen Description BLOOD LEFT ANTECUBITAL  Final   Special Requests   Final    BOTTLES DRAWN AEROBIC AND ANAEROBIC Blood Culture adequate volume   Culture   Final    NO GROWTH 4 DAYS Performed at [facility], [place] [address], [place], [place] [address]    Report Status PENDING  Incomplete  Blood Culture (routine x 2)     Status: None (Preliminary result)   Collection Time: 05/31/21  5:11 PM   Specimen: BLOOD  Result Value Ref Range Status   Specimen Description BLOOD LEFT ANTECUBITAL  Final   Special Requests   Final    AEROBIC BOTTLE ONLY Blood Culture results may not be optimal due to an inadequate volume of blood received in culture bottles   Culture   Final    NO GROWTH 4 DAYS Performed at [facility], [place] [address] [place], [place] [address]    Report Status PENDING  Incomplete  Resp Panel by RT-PCR (Flu A&B, Covid) Nasopharyngeal Swab     Status: None   Collection Time: 05/31/21  7:12 PM   Specimen: Nasopharyngeal Swab; Nasopharyngeal(NP) swabs in vial transport medium  Result Value Ref Range Status   SARS Coronavirus 2 by RT PCR NEGATIVE NEGATIVE Final    Comment: (NOTE) SARS-CoV-2 target nucleic acids are NOT DETECTED.  The SARS-CoV-2 RNA is generally detectable in upper respiratory specimens during the acute phase of infection. The lowest concentration of SARS-CoV-2 viral copies this assay can detect is 138 copies/mL. A negative result  does not preclude SARS-Cov-2 infection and should not be used as the sole basis for treatment or other patient management decisions. A negative result may occur with  improper specimen collection/handling, submission of specimen other than nasopharyngeal swab, presence of viral mutation(s) within the areas targeted by this assay, and inadequate number of viral copies(<138 copies/mL). A negative result must be combined with clinical observations, patient history, and epidemiological information. The expected result is Negative.  Fact Sheet for Patients:  EntrepreneurPulse.com.au  Fact Sheet for Healthcare Providers:  IncredibleEmployment.be  This test is no t yet approved or cleared by the [place] FDA and  has been authorized for detection and/or diagnosis of SARS-CoV-2 by FDA under an Emergency Use Authorization (EUA). This EUA will remain  in effect (meaning this test can be used) for the duration of the COVID-19 declaration under Section 564(b)(1) of the Act, 21 U.S.C.section 360bbb-3(b)(1), unless the authorization is terminated  or revoked sooner.       Influenza A by PCR NEGATIVE NEGATIVE Final   Influenza B by PCR NEGATIVE NEGATIVE Final    Comment: (NOTE) The Xpert Xpress SARS-CoV-2/FLU/RSV plus assay is intended as an aid in the diagnosis of influenza from Nasopharyngeal swab specimens and should not be used as a sole basis for treatment. Nasal washings and aspirates are unacceptable for Xpert Xpress SARS-CoV-2/FLU/RSV testing.  Fact Sheet for Patients: EntrepreneurPulse.com.au  Fact Sheet for Healthcare Providers: IncredibleEmployment.be  This test is not yet approved or cleared by the [place] FDA and has been authorized for detection and/or diagnosis of SARS-CoV-2 by FDA under an Emergency Use Authorization (EUA). This EUA will remain in effect (meaning this test can be used) for  the duration of the COVID-19 declaration under Section 564(b)(1) of  the Act, 21 U.S.C. section 360bbb-3(b)(1), unless the authorization is terminated or revoked.  Performed at [facility], [place] [address], [place], [place] [address]   Urine Culture     Status: Abnormal   Collection Time: 06/01/21  2:42 AM   Specimen: In/Out Cath Urine  Result Value Ref Range Status   Specimen Description IN/OUT CATH URINE  Final   Special Requests   Final    NONE Performed at [facility], [place] [address], [place], [place] [address]    Culture MULTIPLE SPECIES PRESENT, SUGGEST RECOLLECTION (A)  Final   Report Status 06/02/2021 FINAL  Final      Radiology Studies: No results found.  Scheduled Meds:  atorvastatin  40 mg Oral Daily   diltiazem  60 mg Oral Q8H   feeding supplement  237 mL Oral TID BM   fluticasone furoate-vilanterol  1 puff Inhalation Daily   metoprolol tartrate  12.5 mg Oral BID   multivitamin  15 mL Oral Daily   pantoprazole (PROTONIX) IV  40 mg Intravenous Q24H   rivastigmine  9.5 mg Transdermal Q24H   umeclidinium bromide  1 puff Inhalation Daily   Continuous Infusions:  aztreonam 1 g (06/05/21 0429)   heparin 1,400 Units/hr (06/05/21 0311)   metronidazole 100 mL/hr at 06/05/21 0311   vancomycin Stopped (06/04/21 2351)     LOS: 5 days   Time spent: 35 minutes.  Little Ishikawa, DO [facility] Pager: Secure chat  06/05/2021, 8:24 AM

## 2021-06-06 ENCOUNTER — Other Ambulatory Visit (HOSPITAL_COMMUNITY): Payer: Self-pay

## 2021-06-06 ENCOUNTER — Encounter (HOSPITAL_COMMUNITY): Payer: Self-pay | Admitting: Internal Medicine

## 2021-06-06 DIAGNOSIS — I2694 Multiple subsegmental pulmonary emboli without acute cor pulmonale: Secondary | ICD-10-CM

## 2021-06-06 LAB — CBC
HCT: 22.7 % — ABNORMAL LOW (ref 36.0–46.0)
Hemoglobin: 7.6 g/dL — ABNORMAL LOW (ref 12.0–15.0)
MCH: 29.2 pg (ref 26.0–34.0)
MCHC: 33.5 g/dL (ref 30.0–36.0)
MCV: 87.3 fL (ref 80.0–100.0)
Platelets: 243 10*3/uL (ref 150–400)
RBC: 2.6 MIL/uL — ABNORMAL LOW (ref 3.87–5.11)
RDW: 20 % — ABNORMAL HIGH (ref 11.5–15.5)
WBC: 14.7 10*3/uL — ABNORMAL HIGH (ref 4.0–10.5)
nRBC: 0.2 % (ref 0.0–0.2)

## 2021-06-06 LAB — CULTURE, BLOOD (ROUTINE X 2)
Culture: NO GROWTH
Culture: NO GROWTH
Special Requests: ADEQUATE

## 2021-06-06 LAB — HEPARIN LEVEL (UNFRACTIONATED): Heparin Unfractionated: 0.55 IU/mL (ref 0.30–0.70)

## 2021-06-06 MED ORDER — APIXABAN 5 MG PO TABS: 5.0000 mg | ORAL_TABLET | Freq: Two times a day (BID) | ORAL | 1 refills | Status: AC

## 2021-06-06 MED ORDER — MORPHINE SULFATE (CONCENTRATE) 10 MG/0.5ML PO SOLN: 5.0000 mg | Freq: Four times a day (QID) | ORAL | 0 refills | Status: AC | PRN

## 2021-06-06 MED ORDER — IBUPROFEN 200 MG PO TABS: 200.0000 mg | ORAL_TABLET | Freq: Four times a day (QID) | ORAL | Status: DC | PRN

## 2021-06-06 MED ORDER — APIXABAN 5 MG PO TABS
5.0000 mg | ORAL_TABLET | Freq: Two times a day (BID) | ORAL | Status: DC
  Administered 2021-06-06: 5 mg via ORAL
  Filled 2021-06-06: qty 1

## 2021-06-06 MED ORDER — PANTOPRAZOLE SODIUM 40 MG PO TBEC
40.0000 mg | DELAYED_RELEASE_TABLET | Freq: Every day | ORAL | Status: DC
  Administered 2021-06-06: 40 mg via ORAL
  Filled 2021-06-06: qty 1

## 2021-06-06 NOTE — Care Management (Signed)
      Durable Medical Equipment  (From admission, onward)           Start     Ordered   06/06/21 1132  For home use only DME Bedside commode  Once       Question:  Patient needs a bedside commode to treat with the following condition  Answer:  Ambulatory dysfunction   06/06/21 1133   06/06/21 1132  For home use only DME lightweight manual wheelchair with seat cushion  Once       Comments: Patient suffers from ambulatory dysfunction which impairs their ability to perform daily activities like bathing, dressing, feeding, grooming, and toileting in the home.  A walker will not resolve  issue with performing activities of daily living. A wheelchair will allow patient to safely perform daily activities. Patient is not able to propel themselves in the home using a standard weight wheelchair due to arm weakness, endurance, and general weakness. Patient can self propel in the lightweight wheelchair. Length of need Lifetime. Accessories: elevating leg rests (ELRs), wheel locks, extensions and anti-tippers.   06/06/21 1133

## 2021-06-06 NOTE — Discharge Instructions (Signed)

## 2021-06-06 NOTE — Progress Notes (Signed)
ANTICOAGULATION CONSULT NOTE - Follow Up Consult  Pharmacy Consult for heparin Indication: pulmonary embolus  Labs: Recent Labs    06/03/21 1541 06/03/21 1541 06/04/21 0152 06/05/21 0704 06/06/21 0117  HGB  --    < > 7.9* 8.0* 7.6*  HCT  --   --  23.7* 23.8* 22.7*  PLT  --   --  174 139* 243  HEPARINUNFRC 0.42  --  0.52 0.49 0.55  CREATININE 0.51  --  0.53 0.50  --    < > = values in this interval not displayed.     Assessment: Anne West is a 68 YOF who presented with confusion, lower abdomina pain, multiple falls in the last week, slurred speech and having issues swallowing (no eating or drinking). On chest CT, found small segmental pulmonary emboli within the right upper, middle and lower lobes and upper left lobe. She is s/p endoscopy with no intervention and had liver biopsy on 8/05. She is also noted with bilateral MCA stroke. Pharmacy consulted to dose heparin and plans to change to apixaban today -with bleeding risk from anemia and CVA will plan to use apixaban '5mg'$  po bid. She has also been on heparin since 8/4. Plan discussed with Dr. Avon Gully   Goal of Therapy:  Monitor platelets by anticoagulation protocol: Yes    Plan:  -stop heparin  -apixaban '5mg'$  po bid   Thank you for involving pharmacy in this patient's care.  Hildred Laser, PharmD Clinical Pharmacist **Pharmacist phone directory can now be found on [email or web address] (PW [email or web address]).  Listed under [place].

## 2021-06-06 NOTE — TOC Initial Note (Signed)
Transition of Care [facility]) - Initial/Assessment Note    Patient Details  Name: Anne West MRN: [id number] Date of Birth: 10-04-1946  [facility] CM/SW Contact:    Bethena Roys, RN Phone Number: 06/06/2021, 2:26 PM  Clinical Narrative:   Case Manager spoke with patient's daughter regarding home health services. Daughter had no preference on agency-just to be in network with insurance. Case Manager called [facility] Lemmings and start of care to begin within 24-48 hours of transition home. Case Manager discussed outpatient palliative care services with the family and they chose [facility]-office to call daughter for visits. Durable Medical Equipment was submitted to [employer] wheelchair and bedside commode. Family will transport the patient home via private vehicle.               Expected Discharge Plan: [place] Barriers to Discharge: No Barriers Identified   Patient Goals and CMS Choice Patient states their goals for this hospitalization and ongoing recovery are:: to return to her daughters home.   Choice offered to / list presented to : NA (Daughter did not have a preference for agency choice just to be in network)  Expected Discharge Plan and Services Expected Discharge Plan: [place]   Discharge Planning Services: CM Consult Post Acute Care Choice: Home Health, Durable Medical Equipment Living arrangements for the past 2 months: Single Family Home Expected Discharge Date: 06/06/21               DME Arranged: Youth worker wheelchair with seat cushion, Bedside commode DME Agency: [employer] Date DME Agency Contacted: 06/06/21 Time DME Agency Contacted: 1100 Representative spoke with at DME Agency: Freda Munro HH Arranged: PT, OT, Nurse's Aide [place] Agency: [place] Date [place]: 06/06/21 Time [place]: 1200 Representative spoke with at [place]  Prior Living  Arrangements/Services Living arrangements for the past 2 months: [place] with:: Self, Adult Children (Going to daughters address [place] [address]) Patient language and need for interpreter reviewed:: Yes Do you feel safe going back to the place where you live?: Yes      Need for Family Participation in Patient Care: Yes (Comment) Care giver support system in place?: Yes (comment)   Criminal Activity/Legal Involvement Pertinent to Current Situation/Hospitalization: No - Comment as needed  Activities of Daily Living   ADL Screening (condition at time of admission) Patient's cognitive ability adequate to safely complete daily activities?: No Is the patient deaf or have difficulty hearing?: No Does the patient have difficulty seeing, even when wearing glasses/contacts?: No Does the patient have difficulty concentrating, remembering, or making decisions?: Yes Patient able to express need for assistance with ADLs?: No  Permission Sought/Granted Permission sought to share information with : Case Manager, Customer service manager, Family Supports Permission granted to share information with : Yes, Verbal Permission Granted     Permission granted to share info w AGENCY: [employer], [facility]        Emotional Assessment Appearance:: Appears stated age Attitude/Demeanor/Rapport: Engaged Affect (typically observed): Appropriate Orientation: : Oriented to Situation, Oriented to Place, Oriented to Self, Oriented to  Time Alcohol / Substance Use: Not Applicable Psych Involvement: No (comment)  Admission diagnosis:  Metastases to the liver ([place]) [C78.7] Elevated LFTs [R79.89] Acute pulmonary embolism ([place]) [I26.99] Fall, initial encounter [W19.XXXA] AMS (altered mental status) [R41.82] Multiple subsegmental pulmonary emboli without acute cor pulmonale (HCC) [I26.94] Patient Active Problem List  Diagnosis Date Noted   Goals of care, counseling/discussion     Protein-calorie malnutrition, severe 06/03/2021   Cerebral embolism with cerebral infarction 06/03/2021   Metastases to the liver [facility])    Dysphagia    Abnormal CT of the abdomen    Multiple subsegmental pulmonary emboli without acute cor pulmonale ([place]) 05/31/2021   Sepsis ([place]) 123XX123   Acute metabolic encephalopathy 123XX123   Elevated liver enzymes 05/31/2021   Anemia 05/31/2021   PCP:  Default, Provider, MD Pharmacy:   [facility] [id number] [place], [address] AT [facility] [place] [address] Phone: [phone] Fax: [phone]  [facility] [id number] - [place], [place] - [address] AT [place] [address] [place]  [address] Phone: [phone] Fax: [phone]    Readmission Risk Interventions No flowsheet data found.

## 2021-06-06 NOTE — Care Management Important Message (Signed)
Important Message  Patient Details  Name: Anne West MRN: [id number] Date of Birth: 1946-09-30   Medicare Important Message Given:  Yes     Shelda Altes 06/06/2021, 10:04 AM

## 2021-06-06 NOTE — Discharge Summary (Signed)
Physician Discharge Summary  ABIGAL CHOUNG JYN:[id number] DOB: [birth year] DOA: 05/31/2021  PCP: Default, Provider, MD  Admit date: 05/31/2021 Discharge date: 06/06/2021  Admitted From: Home Disposition: Home  Recommendations for Outpatient Follow-up:  Follow up with PCP, oncology, palliative care as discussed  Home Health: PT OT aide Equipment/Devices: Wheelchair, bedside commode  Discharge Condition: Guarded CODE STATUS: Full Diet recommendation: As tolerated  Brief/Interim Summary: ODESTER NILSON is a 75 y.o. female with a history of AFib not on anticoagulation, COPD, HTN, HLD, cognitive impairment, AAA s/p repair, and recent hospitalization for abdominal pain thought to be due to constipation where she was found to have numerous liver lesions and thickened gallbladder without other evidence of cholecystitis. She returned home and has continued to eat poorly, grow more confused than baseline and have several falls. Due to this and some slurred speech, her daughter brought her to the ED where she was febrile at 100.30F, tachycardic (sinus), without hypotension or hypoxia. WBC 14.4k, hgb 10.4g/dl, platelets 103k. SCr 0.7. AST 133, ALT 145, alk phos 814, TBili 2.5. Covid, flue PCR negative. Vast imaging evaluation revealed hepatic lesions, stable gallbladder wall thickening without cholelithiasis or pericholecystic fluid to suggest cholecystitis, and small bilateral pulmonary emboli.   Blood cultures were drawn, broad IV antibiotics started for SIRS (NOS), IV heparin was started for PE. GI was consulted for dysphagia, performed EGD 06/02/2021 showing non-bleeding gastroduodenitis, which was biopsied, without evidence of malignancy (full report below). IR is consulted and will perform U/S-guided biopsy of liver lesion. Oncology has made recommendations to complete initial work up and will follow up with the patient after pathology results have returned.  Bilateral MCA CVAs, acute cerebellar  CVA: Distribution consistent with embolic showering. - Neurology consulted - now signed off - appreciate recs - Echo unremarkable - HDL low - lipid panel otherwise unremarkable; A1C WNL - Known history of atrial fibrillation. -Continue home health PT with DME as requested -Transition to Eliquis coagulation   SIRS: WITHOUT source, does not meet sepsis criteria -Antibiotics discontinued 06/05/2021, no source ever identified -Fevers likely in the setting of cancer and blood clots -Follow closely with PCP for any new symptoms   Acute pulmonary emboli: Provoked by malignancy. Not hemodynamically compromising or causing hypoxia. -Transition to Eliquis   Metastatic cancer, unknown primary: Numerous hepatic lesions on CT. - IR biopsy 8/5 -follow-up outpatient for results - CA-125 4,263 and CEA 1,488 grossly elevated. - Oncology, Dr. Chryl Heck, consulted and discussed with the family by phone the plan for expedited oncology clinic follow up once biopsy results are back.   Dementia with acute delirium/altered mental status from baseline: - Has known history of episodes of delirium at previous hospitalizations - Attempting to avoid narcotics given patient's mental status and high risk for delirium in the setting of dementia - Continue exelon   Falls, increasing confusion, slurred speech: -Continue home health PT   Unintentional weight loss, poor per oral intake:  - Likely related to metastatic cancer, no anatomical explanation found on EGD. - Dietitian consult -very poor p.o. intake, continue to encourage with family   Gastroduodenitis: - Continue PPI, advance diet as tolerated per GI who previously signed off.   HLD: LFT elevation noted as above, likely due to metastases. CK wnl. - Check lipid panel, start high-intensity statin (atorvastatin 34m > 858m   COPD: - Prn BDs. Continue trelegy     HTN: - Hold norvasc 37m24mbut continue AV nodal agents due to PAF but permissive HTN otherwise.  PAF:  -Continue Eliquis as above continue rate correct control with Cardizem, metoprolol   Thrombocytopenia: Stable/improving - Continue to monitor for bleeding.   Normocytic anemia: - Likelyy due to metastatic disease. B12 sufficient, ferritin grossly elevated with low TIBC  Discharge Instructions  Discharge Instructions     Ambulatory referral to Neurology   Complete by: As directed    Follow up with stroke clinic NP (Jessica Vanschaick or Cecille Rubin, if both not available, consider Zachery Dauer, or [facility]) at [facility] in about 4 weeks. Thanks.   Diet - low sodium heart healthy   Complete by: As directed    Increase activity slowly   Complete by: As directed    No wound care   Complete by: As directed       Allergies as of 06/06/2021       Reactions   Donepezil Itching   SEVERE ITCHING   Penicillins Cross Reactors    Unknown    Sulfa Antibiotics         Medication List     STOP taking these medications    amLODipine 5 MG tablet Commonly known as: NORVASC   aspirin EC 81 MG tablet   gabapentin 800 MG tablet Commonly known as: NEURONTIN   HYDROcodone-acetaminophen 7.5-325 MG tablet Commonly known as: NORCO   hydrOXYzine 25 MG tablet Commonly known as: ATARAX/VISTARIL   ibuprofen 200 MG tablet Commonly known as: ADVIL   zolpidem 5 MG tablet Commonly known as: AMBIEN       TAKE these medications    albuterol 108 (90 Base) MCG/ACT inhaler Commonly known as: VENTOLIN HFA Inhale 2 puffs into the lungs every 6 (six) hours as needed for wheezing.   apixaban 5 MG Tabs tablet Commonly known as: ELIQUIS Take 1 tablet (5 mg total) by mouth 2 (two) times daily.   atorvastatin 40 MG tablet Commonly known as: LIPITOR Take 40 mg by mouth daily.   Cholecalciferol 25 MCG (1000 UT) capsule Take 1,000 Units by mouth daily.   Dilt-XR 180 MG 24 hr capsule Generic drug: diltiazem Take 180 mg by mouth daily.   fluticasone 50 MCG/ACT nasal  spray Commonly known as: FLONASE Place 1 spray into the nose daily as needed for allergies or rhinitis.   Magnesium 250 MG Tabs Take 250 mg by mouth daily.   metoprolol succinate 25 MG 24 hr tablet Commonly known as: TOPROL-XL Take 25 mg by mouth daily.   morphine CONCENTRATE 10 MG/0.5ML Soln concentrated solution Place 0.25-0.5 mLs (5-10 mg total) under the tongue every 6 (six) hours as needed for up to 3 days for severe pain or moderate pain.   polyethylene glycol powder 17 GM/SCOOP powder Commonly known as: GLYCOLAX/MIRALAX Take 17 g by mouth daily as needed.   POTASSIUM PO Take 1 tablet by mouth daily. OTC   rivastigmine 4.5 MG capsule Commonly known as: EXELON Take 4.5 mg by mouth 2 (two) times daily.   Trelegy Ellipta 100-62.5-25 MCG/INH Aepb Generic drug: Fluticasone-Umeclidin-Vilant Take 1 puff by mouth daily.               Durable Medical Equipment  (From admission, onward)           Start     Ordered   06/06/21 1132  For home use only DME Bedside commode  Once       Question:  Patient needs a bedside commode to treat with the following condition  Answer:  Ambulatory dysfunction   06/06/21 1133   06/06/21  1132  For home use only DME lightweight manual wheelchair with seat cushion  Once       Comments: Patient suffers from ambulatory dysfunction which impairs their ability to perform daily activities like bathing, dressing, feeding, grooming, and toileting in the home.  A walker will not resolve  issue with performing activities of daily living. A wheelchair will allow patient to safely perform daily activities. Patient is not able to propel themselves in the home using a standard weight wheelchair due to arm weakness, endurance, and general weakness. Patient can self propel in the lightweight wheelchair. Length of need Lifetime. Accessories: elevating leg rests (ELRs), wheel locks, extensions and anti-tippers.   06/06/21 1133            Follow-up  Information     [facility]. Schedule an appointment as soon as possible for a visit in 1 month(s).   Specialty: Neurology Why: stroke clinic Contact information: [facility] Follow up.   Why: Outpatient Palliative-office to call with vist times. Contact information: [place] [address] [facility] Follow up.   Why: Wheelchair and Bedside commode to be delivered to the home.Minette Brine informationKarene Fry [place] [address] [phone]         Care, [facility] Follow up.   Specialty: Home Health Services Why: Physical Therapy, Occupational Therapy, Aide-offcei to call with a visit time. Contact information: [address] [place] [address] [phone]                Allergies  Allergen Reactions   Donepezil Itching    SEVERE ITCHING   Penicillins Cross Reactors     Unknown    Sulfa Antibiotics     Consultations: GI, IR, oncology   Procedures/Studies: DG Chest 1 View  Result Date: 05/31/2021 CLINICAL DATA:  Multiple falls, slurred speech, abdominal pain EXAM: CHEST  1 VIEW COMPARISON:  05/16/2021 FINDINGS: Single frontal view of the chest demonstrates an unremarkable cardiac silhouette. No acute airspace disease, effusion, or pneumothorax. Chronic interstitial scarring is noted. No acute bony abnormalities. Partial visualization of aortic stent. IMPRESSION: 1. No acute intrathoracic process. Electronically Signed   By: Randa Ngo M.D.   On: 05/31/2021 18:28   CT HEAD WO CONTRAST (5MM)  Result Date: 05/31/2021 CLINICAL DATA:  Mental status change, unknown cause; Neck trauma (Age >= 65y); Facial trauma. Increased confusion, slurred speech and multiple falls over the past week. EXAM: CT HEAD WITHOUT CONTRAST CT MAXILLOFACIAL WITHOUT  CONTRAST CT CERVICAL SPINE WITHOUT CONTRAST TECHNIQUE: Multidetector CT imaging of the head, cervical spine, and maxillofacial structures were performed using the standard protocol without intravenous contrast. Multiplanar CT image reconstructions of the cervical spine and maxillofacial structures were also generated. COMPARISON:  None. FINDINGS: CT HEAD FINDINGS Brain: Normal anatomic configuration. Parenchymal volume loss is commensurate with the patient's age. Moderate periventricular white matter changes are present likely reflecting the sequela of small vessel ischemia. Remote lacunar infarct versus dilated perivascular space within the right basal ganglia. No abnormal intra or extra-axial mass lesion or fluid collection. No abnormal mass effect or midline shift. No evidence of acute intracranial hemorrhage or infarct. Ventricular size is normal. Cerebellum unremarkable. Vascular: No asymmetric hyperdense vasculature at the skull base. Skull: Intact Other: Mastoid air cells and middle ear cavities are  clear. CT MAXILLOFACIAL FINDINGS Osseous: Mild motion artifact noted. No acute facial fracture. No mandibular dislocation. Orbits: Negative. No traumatic or inflammatory finding. Sinuses: Clear. Soft tissues: Negative. CT CERVICAL SPINE FINDINGS Alignment: Normal.  No listhesis. Skull base and vertebrae: Craniocervical alignment is normal. The atlantodental interval is not widened. There is no acute fracture of the cervical spine. 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 No canal hematoma. The spinal canal is otherwise widely patent. No prevertebral soft tissue swelling. No pathologic adenopathy within the neck. No paraspinal fluid collections identified. Disc levels: There is mild intervertebral disc calcifications and posterior disc bulges noted at C3-C7 in keeping with changes of mild  degenerative disc disease. Intervertebral disc height is preserved. Vertebral body height is preserved. The prevertebral soft tissues are not thickened on sagittal reformats. 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 Upper chest: Unremarkable Other: None IMPRESSION: No acute intracranial injury.  No calvarial fracture. No acute facial fracture. No acute fracture or listhesis the cervical spine. Electronically Signed   By: Fidela Salisbury MD   On: 05/31/2021 21:03   CT Angio Chest PE W and/or Wo Contrast  Result Date: 05/31/2021 CLINICAL DATA:  Abdominal pain, elevated LFTs and encephalopathy EXAM: CT ANGIOGRAPHY CHEST CT ABDOMEN AND PELVIS WITH CONTRAST TECHNIQUE: Multidetector CT imaging of the chest was performed using the standard protocol during bolus administration of intravenous contrast. Multiplanar CT image reconstructions and MIPs were obtained to evaluate the vascular anatomy. Multidetector CT imaging of the abdomen and pelvis was performed using the standard protocol during bolus administration of intravenous contrast. CONTRAST:  121m OMNIPAQUE IOHEXOL 350 MG/ML SOLN COMPARISON:  None. FINDINGS: CTA CHEST FINDINGS Cardiovascular: Contrast injection is sufficient to demonstrate satisfactory opacification of the pulmonary arteries to the segmental level.There are small segmental pulmonary emboli within the right upper, middle and lower lobes. On the left, there is a segmental embolus in the left upper lobe. The main pulmonary artery is enlarged, measuring 4.1 cm. There is no CT evidence of acute right heart strain. There is moderate aortic atherosclerosis. There is a normal 3-vessel arch branching pattern. Heart size is normal. No pericardial effusion. There are coronary artery calcifications. Mediastinum/Nodes: No mediastinal, hilar or axillary lymphadenopathy. The visualized thyroid and  thoracic esophageal course are unremarkable. Lungs/Pleura: No pulmonary nodules or masses. No pleural effusion or pneumothorax. No focal airspace consolidation. No focal pleural abnormality. Musculoskeletal: No chest wall abnormality. No acute or significant osseous findings. Review of the MIP images confirms the above findings. CT ABDOMEN and PELVIS FINDINGS Hepatobiliary: Innumerable lesions throughout the liver. The largest lesion is in the right hepatic lobe and measures approximately 5.7 cm. Mild gallbladder wall thickening Pancreas: Pancreatic duct is dilated in the distal body and tail. The tail is atrophic. Spleen: Normal. Adrenals/Urinary Tract: --Adrenal glands: Normal. --Right kidney/ureter: No hydronephrosis or perinephric stranding. No nephrolithiasis. No obstructing ureteral stones. --Left kidney/ureter: No hydronephrosis or perinephric stranding. No nephrolithiasis. No obstructing ureteral stones. --Urinary bladder: Unremarkable. Stomach/Bowel: --Stomach/Duodenum: No hiatal hernia or other gastric abnormality. Normal duodenal course and caliber. --Small bowel: No dilatation or inflammation. --Colon: No focal abnormality. --Appendix: Not visualized. No right lower quadrant inflammation or free fluid. Vascular/Lymphatic: Aortic atherosclerosis. Aorta bi-iliac stent is patent. 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 No abdominal or pelvic lymphadenopathy. Reproductive: Status post hysterectomy. No adnexal mass. Small volume free fluid in the pelvis. Musculoskeletal. No bony spinal canal stenosis or focal osseous abnormality. Other: None. IMPRESSION: 1. Small segmental pulmonary emboli within the right upper, middle and lower lobes and left upper lobe. No CT evidence of acute right heart strain. 2. Innumerable liver lesions, consistent with metastatic disease. 3. Dilated main  pulmonary artery, consistent with pulmonary hypertension. 4. 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 Critical Value/emergent results were called by telephone at the time of interpretation on 05/31/2021 at 9:10 pm to provider DAVID YAO , who verbally acknowledged these results. Aortic Atherosclerosis (ICD10-I70.0). Electronically Signed   By: Ulyses Jarred M.D.   On: 05/31/2021 21:11   CT Cervical Spine Wo Contrast  Result Date: 05/31/2021 CLINICAL DATA:  Mental status change, unknown cause; Neck trauma (Age >= 65y); Facial trauma. Increased confusion, slurred speech and multiple falls over the past week. EXAM: CT HEAD WITHOUT CONTRAST CT MAXILLOFACIAL WITHOUT CONTRAST CT CERVICAL SPINE WITHOUT CONTRAST TECHNIQUE: Multidetector CT imaging of the head, cervical spine, and maxillofacial structures were performed using the standard protocol without intravenous contrast. Multiplanar CT image reconstructions of the cervical spine and maxillofacial structures were also generated. COMPARISON:  None. FINDINGS: CT HEAD FINDINGS Brain: Normal anatomic configuration. Parenchymal volume loss is commensurate with the patient's age. Moderate periventricular white matter changes are present likely reflecting the sequela of small vessel ischemia. Remote lacunar infarct versus dilated perivascular space within the right basal ganglia. No abnormal intra or extra-axial mass lesion or fluid collection. No abnormal mass effect or midline shift. No evidence of acute intracranial hemorrhage or infarct. Ventricular size is normal. Cerebellum unremarkable. Vascular: No asymmetric hyperdense vasculature at the skull base. Skull: Intact Other: Mastoid air cells and middle ear cavities are clear. CT MAXILLOFACIAL FINDINGS Osseous: Mild motion artifact noted. No acute facial fracture. No mandibular dislocation. Orbits: Negative. No traumatic or inflammatory finding. Sinuses: Clear. Soft  tissues: Negative. CT CERVICAL SPINE FINDINGS Alignment: Normal.  No listhesis. Skull base and vertebrae: Craniocervical alignment is normal. The atlantodental interval is not widened. There is no acute fracture of the cervical spine. 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 No canal hematoma. The spinal canal is otherwise widely patent. No prevertebral soft tissue swelling. No pathologic adenopathy within the neck. No paraspinal fluid collections identified. Disc levels: There is mild intervertebral disc calcifications and posterior disc bulges noted at C3-C7 in keeping with changes of mild degenerative disc disease. Intervertebral disc height is preserved. Vertebral body height is preserved. The prevertebral soft tissues are not thickened on sagittal reformats. 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 Upper chest: Unremarkable Other: None IMPRESSION: No acute intracranial injury.  No calvarial fracture. No acute facial fracture. No acute fracture or listhesis the cervical spine. Electronically Signed   By: Fidela Salisbury MD   On: 05/31/2021 21:03   MR ANGIO HEAD WO CONTRAST  Result Date: 06/02/2021 CLINICAL DATA:  Neuro deficit, acute, stroke suspected EXAM: MRA HEAD WITHOUT CONTRAST TECHNIQUE: Angiographic images of the Circle of Willis were acquired using MRA technique without intravenous contrast. COMPARISON:  No pertinent prior exam. FINDINGS: POSTERIOR CIRCULATION: --Vertebral arteries: Normal --Inferior cerebellar arteries: Normal. --Basilar  artery: Normal. --Superior cerebellar arteries: Normal. --Posterior cerebral arteries: Normal. ANTERIOR CIRCULATION: --Intracranial internal carotid arteries: Normal. --Anterior cerebral arteries (ACA): Normal. --Middle cerebral  arteries (MCA): Normal. ANATOMIC VARIANTS: Fetal origins of both posterior cerebral arteries. IMPRESSION: Normal intracranial MRA. Electronically Signed   By: Ulyses Jarred M.D.   On: 06/02/2021 22:53   MR BRAIN WO CONTRAST  Result Date: 06/01/2021 CLINICAL DATA:  Neuro deficit, acute, stroke suspected EXAM: MRI HEAD WITHOUT CONTRAST TECHNIQUE: Multiplanar, multiecho pulse sequences of the brain and surrounding structures were obtained without intravenous contrast. COMPARISON:  None. FINDINGS: Brain: Multifocal bilateral acute or early subacute ischemia within the MCA territories. There is also punctate focus of acute ischemia in the cerebellar vermis. No acute or chronic hemorrhage. There is multifocal hyperintense T2-weighted signal within the white matter. Generalized volume loss without a clear lobar predilection. The midline structures are normal. Vascular: Major flow voids are preserved. Skull and upper cervical spine: Normal calvarium and skull base. Visualized upper cervical spine and soft tissues are normal. Sinuses/Orbits:No paranasal sinus fluid levels or advanced mucosal thickening. No mastoid or middle ear effusion. Normal orbits. IMPRESSION: 1. Multifocal bilateral acute or early subacute ischemia within the MCA territories. No hemorrhage or mass effect. 2. Punctate focus of acute ischemia in the cerebellar vermis. 3. Atrophy and findings of chronic microvascular ischemia. Electronically Signed   By: Ulyses Jarred M.D.   On: 06/01/2021 22:26   CT ABDOMEN PELVIS W CONTRAST  Result Date: 05/31/2021 CLINICAL DATA:  Abdominal pain, elevated LFTs and encephalopathy EXAM: CT ANGIOGRAPHY CHEST CT ABDOMEN AND PELVIS WITH CONTRAST TECHNIQUE: Multidetector CT imaging of the chest was performed using the standard protocol during bolus administration of intravenous contrast. Multiplanar CT image reconstructions and MIPs were obtained to evaluate the vascular anatomy. Multidetector CT imaging of the abdomen  and pelvis was performed using the standard protocol during bolus administration of intravenous contrast. CONTRAST:  151m OMNIPAQUE IOHEXOL 350 MG/ML SOLN COMPARISON:  None. FINDINGS: CTA CHEST FINDINGS Cardiovascular: Contrast injection is sufficient to demonstrate satisfactory opacification of the pulmonary arteries to the segmental level.There are small segmental pulmonary emboli within the right upper, middle and lower lobes. On the left, there is a segmental embolus in the left upper lobe. The main pulmonary artery is enlarged, measuring 4.1 cm. There is no CT evidence of acute right heart strain. There is moderate aortic atherosclerosis. There is a normal 3-vessel arch branching pattern. Heart size is normal. No pericardial effusion. There are coronary artery calcifications. Mediastinum/Nodes: No mediastinal, hilar or axillary lymphadenopathy. The visualized thyroid and thoracic esophageal course are unremarkable. Lungs/Pleura: No pulmonary nodules or masses. No pleural effusion or pneumothorax. No focal airspace consolidation. No focal pleural abnormality. Musculoskeletal: No chest wall abnormality. No acute or significant osseous findings. Review of the MIP images confirms the above findings. CT ABDOMEN and PELVIS FINDINGS Hepatobiliary: Innumerable lesions throughout the liver. The largest lesion is in the right hepatic lobe and measures approximately 5.7 cm. Mild gallbladder wall thickening Pancreas: Pancreatic duct is dilated in the distal body and tail. The tail is atrophic. Spleen: Normal. Adrenals/Urinary Tract: --Adrenal glands: Normal. --Right kidney/ureter: No hydronephrosis or perinephric stranding. No nephrolithiasis. No obstructing ureteral stones. --Left kidney/ureter: No hydronephrosis or perinephric stranding. No nephrolithiasis. No obstructing ureteral stones. --Urinary bladder: Unremarkable. Stomach/Bowel: --Stomach/Duodenum: No hiatal hernia or other gastric abnormality. Normal duodenal  course and caliber. --Small bowel: No dilatation or inflammation. --Colon: No focal abnormality. --Appendix: Not visualized. No right lower quadrant inflammation or free fluid. Vascular/Lymphatic: Aortic  atherosclerosis. Aorta bi-iliac stent is patent. 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 No abdominal or pelvic lymphadenopathy. Reproductive: Status post hysterectomy. No adnexal mass. Small volume free fluid in the pelvis. Musculoskeletal. No bony spinal canal stenosis or focal osseous abnormality. Other: None. IMPRESSION: 1. Small segmental pulmonary emboli within the right upper, middle and lower lobes and left upper lobe. No CT evidence of acute right heart strain. 2. Innumerable liver lesions, consistent with metastatic disease. 3. Dilated main pulmonary artery, consistent with pulmonary hypertension. 4. 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 Critical Value/emergent results were called by telephone at the time of interpretation on 05/31/2021 at 9:10 pm to provider DAVID YAO , who verbally acknowledged these results. Aortic Atherosclerosis (ICD10-I70.0). Electronically Signed   By: Ulyses Jarred M.D.   On: 05/31/2021 21:11   IR US Guide Bx Asp/Drain  Result Date: 06/02/2021 INDICATION: 75 year old female with a known metastatic disease to the liver, presents for biopsy EXAM: IR ULTRASOUND GUIDANCE MEDICATIONS: None. ANESTHESIA/SEDATION: Moderate (conscious) sedation was employed during this procedure. A total of Versed 1.5 mg and Fentanyl 70 mcg was administered intravenously. Moderate Sedation Time: 10 minutes. The patient's level of consciousness and vital signs were monitored continuously by radiology nursing throughout the procedure under my direct supervision. FLUOROSCOPY TIME:  Ultrasound COMPLICATIONS: None PROCEDURE: Informed  written consent was obtained from the patient's family after a thorough discussion of the procedural risks, benefits and alternatives. All questions were addressed. Maximal Sterile Barrier Technique was utilized including caps, mask, sterile gowns, sterile gloves, sterile drape, hand hygiene and skin antiseptic. A timeout was performed prior to the initiation of the procedure. Ultrasound survey of the right liver lobe performed with images stored and sent to PACs. The right lower thorax/right upper abdomen was prepped with chlorhexidine in a sterile fashion, and a sterile drape was applied covering the operative field. A sterile gown and sterile gloves were used for the procedure. Local anesthesia was provided with 1% Lidocaine. The patient was prepped and draped sterilely and the skin and subcutaneous tissues were generously infiltrated with 1% lidocaine. A 17 gauge introducer needle was then advanced under ultrasound guidance in an intercostal location into the right liver lobe, targeting heterogeneously hypoechoic mass of the right liver. The stylet was removed, and multiple separate 18 gauge core biopsy were retrieved. Samples were placed into formalin for transportation to the lab. Gel-Foam pledgets were then infused with a small amount of saline for assistance with hemostasis. The needle was removed, and a final ultrasound image was performed. The patient tolerated the procedure well and remained hemodynamically stable throughout. No complications were encountered and no significant blood loss was encounter. IMPRESSION: Status post ultrasound-guided liver mass biopsy. Signed, Dulcy Fanny. Dellia Nims, RPVI [facility] Radiology Electronically Signed   By: Corrie Mckusick D.O.   On: 06/02/2021 14:06   ECHOCARDIOGRAM COMPLETE  Result Date: 06/02/2021    ECHOCARDIOGRAM REPORT   Patient Name:   JACKQUELYN SUNDBERG Date of Exam: 06/02/2021 Medical Rec #:  [id number]            Height:       67.0 in Accession #:    [id number]          Weight:       130.0 lb Date of Birth:  1945/12/15  BSA:          1.684 m Patient Age:    38 years            BP:           115/50 mmHg Patient Gender: F                   HR:           78 bpm. Exam Location:  Inpatient Procedure: 2D Echo, Cardiac Doppler and Color Doppler Indications:    Pulmonary embolus  History:        Patient has no prior history of Echocardiogram examinations.                 COPD, Arrythmias:Atrial Fibrillation; Risk Factors:Hypertension                 and Dyslipidemia. H/O AAA s/p repair.  Sonographer:    Clayton Lefort RDCS (AE) Referring Phys: [place]  1. Left ventricular ejection fraction, by estimation, is 55 to 60%. The left ventricle has normal function. The left ventricle has no regional wall motion abnormalities. There is moderate concentric left ventricular hypertrophy. Left ventricular diastolic parameters were normal.  2. Right ventricular systolic function is normal. The right ventricular size is normal. There is normal pulmonary artery systolic pressure.  3. The mitral valve is normal in structure. No evidence of mitral valve regurgitation. No evidence of mitral stenosis.  4. The aortic valve is normal in structure. Aortic valve regurgitation is mild. No aortic stenosis is present. FINDINGS  Left Ventricle: Left ventricular ejection fraction, by estimation, is 55 to 60%. The left ventricle has normal function. The left ventricle has no regional wall motion abnormalities. The left ventricular internal cavity size was normal in size. There is  moderate concentric left ventricular hypertrophy. Left ventricular diastolic parameters were normal. Right Ventricle: The right ventricular size is normal. Right vetricular wall thickness was not well visualized. Right ventricular systolic function is normal. There is normal pulmonary artery systolic pressure. The tricuspid regurgitant velocity is 2.35  m/s, and with an assumed right atrial pressure of 8 mmHg, the estimated right ventricular systolic pressure is 23.5 mmHg. Left Atrium: Left atrial size was normal in size. Right Atrium: Right atrial size was normal in size. Pericardium: There is no evidence of pericardial effusion. Mitral Valve: The mitral valve is normal in structure. No evidence of mitral valve regurgitation. No evidence of mitral valve stenosis. Tricuspid Valve: The tricuspid valve is grossly normal. Tricuspid valve regurgitation is trivial. Aortic Valve: The aortic valve is normal in structure. Aortic valve regurgitation is mild. No aortic stenosis is present. Aortic valve mean gradient measures 2.0 mmHg. Aortic valve peak gradient measures 4.2 mmHg. Aortic valve area, by VTI measures 2.95 cm. Pulmonic Valve: The pulmonic valve was grossly normal. Pulmonic valve regurgitation is not visualized. Aorta: The aortic root and ascending aorta are structurally normal, with no evidence of dilitation. IAS/Shunts: The atrial septum is grossly normal.  LEFT VENTRICLE PLAX 2D LVIDd:         3.80 cm  Diastology LVIDs:         2.60 cm  LV e' medial:    10.60 cm/s LV PW:         1.40 cm  LV E/e' medial:  7.1 LV IVS:        1.50 cm  LV e' lateral:   11.10 cm/s LVOT diam:     2.20 cm  LV E/e' lateral: 6.7 LV SV:         65 LV SV Index:   39 LVOT Area:     3.80 cm  RIGHT VENTRICLE             IVC RV Basal diam:  2.70 cm     IVC diam: 0.90 cm RV S prime:     11.20 cm/s TAPSE (M-mode): 1.9 cm LEFT ATRIUM             Index       RIGHT ATRIUM           Index LA diam:        3.50 cm 2.08 cm/m  RA Area:     11.40 cm LA Vol (A2C):   49.6 ml 29.45 ml/m RA Volume:   21.50 ml  12.77 ml/m LA Vol (A4C):   33.5 ml 19.89 ml/m LA Biplane Vol: 42.3 ml 25.12 ml/m  AORTIC VALVE AV Area (Vmax):    3.20 cm AV Area (Vmean):   2.95 cm AV Area (VTI):     2.95 cm AV Vmax:           103.00 cm/s AV Vmean:          73.500 cm/s AV VTI:            0.222 m AV Peak Grad:      4.2  mmHg AV Mean Grad:      2.0 mmHg LVOT Vmax:         86.80 cm/s LVOT Vmean:        57.000 cm/s LVOT VTI:          0.172 m LVOT/AV VTI ratio: 0.77  AORTA Ao Root diam: 3.70 cm MITRAL VALVE                TRICUSPID VALVE MV Area (PHT): 3.72 cm     TR Peak grad:   22.1 mmHg MV Decel Time: 204 msec     TR Vmax:        235.00 cm/s MV E velocity: 74.90 cm/s MV A velocity: 119.00 cm/s  SHUNTS MV E/A ratio:  0.63         Systemic VTI:  0.17 m                             Systemic Diam: 2.20 cm Mertie Moores MD Electronically signed by Mertie Moores MD Signature Date/Time: 06/02/2021/12:35:38 PM    Final    VAS US CAROTID  Result Date: 06/02/2021 Carotid Arterial Duplex Study Patient Name:  DARIAN ACE  Date of Exam:   06/02/2021 Medical Rec #: [id number]            Accession #:    [id number] Date of Birth: 02-17-1946             Patient Gender: F Patient Age:   38 years Exam Location:  [facility] Procedure:      VAS US CAROTID Referring Phys: Alferd Patee [facility] --------------------------------------------------------------------------------  Indications:  CVA. Risk Factors: Hypertension, hyperlipidemia, current smoker. Performing Technologist: Darlin Coco RDMS,RVT  Examination Guidelines: A complete evaluation includes B-mode imaging, spectral Doppler, color Doppler, and power Doppler as needed of all accessible portions of each vessel. Bilateral testing is considered an integral part of a complete examination. Limited examinations for reoccurring indications may be performed as noted.  Right Carotid Findings: +----------+--------+--------+--------+---------------------+------------------+  PSV cm/sEDV cm/sStenosisPlaque Description   Comments           +----------+--------+--------+--------+---------------------+------------------+ CCA Prox  63      9                                    intimal thickening +----------+--------+--------+--------+---------------------+------------------+ CCA  Distal58      8                                    intimal thickening +----------+--------+--------+--------+---------------------+------------------+ ICA Prox  48      10      1-39%   hyperechoic,                                                              heterogenous and                                                          irregular                               +----------+--------+--------+--------+---------------------+------------------+ ICA Distal83      18                                                      +----------+--------+--------+--------+---------------------+------------------+ ECA       81                                                              +----------+--------+--------+--------+---------------------+------------------+ +----------+--------+-------+----------------+-------------------+           PSV cm/sEDV cmsDescribe        Arm Pressure (mmHG) +----------+--------+-------+----------------+-------------------+ [id number]            Multiphasic, WNL                    +----------+--------+-------+----------------+-------------------+ +---------+--------+--+--------+--+---------+ VertebralPSV cm/s37EDV cm/s10Antegrade +---------+--------+--+--------+--+---------+  Left Carotid Findings: +----------+--------+--------+--------+--------------------+-------------------+           PSV cm/sEDV cm/sStenosisPlaque Description  Comments            +----------+--------+--------+--------+--------------------+-------------------+ CCA Prox  90      9                                   intimal thickening  +----------+--------+--------+--------+--------------------+-------------------+ CCA Distal71      9  intimal thickening  +----------+--------+--------+--------+--------------------+-------------------+ ICA Prox  220     44      60-79%  hyperechoic,        Velocities may                                         heterogenous,       underestimate                                         irregular and       degree of stenosis                                    calcific            due to more                                                               proximal                                                                  obstruction.        +----------+--------+--------+--------+--------------------+-------------------+ ICA Mid   174     26                                                      +----------+--------+--------+--------+--------------------+-------------------+ ICA Distal81      22                                                      +----------+--------+--------+--------+--------------------+-------------------+ ECA       70                                                              +----------+--------+--------+--------+--------------------+-------------------+ +----------+--------+--------+--------+-------------------+           PSV cm/sEDV cm/sDescribeArm Pressure (mmHG) +----------+--------+--------+--------+-------------------+ Subclavian307             Stenotic                    +----------+--------+--------+--------+-------------------+ +---------+--------+--+--------+--+---------+ VertebralPSV cm/s46EDV cm/s11Antegrade +---------+--------+--+--------+--+---------+   Summary: Right Carotid: Velocities in the right ICA are consistent with a 1-39% stenosis. Left Carotid: Velocities in the left ICA are consistent with a 60-79% stenosis  by ICA/CCA ratio. Vertebrals:  Bilateral vertebral arteries demonstrate antegrade flow. Subclavians: Left subclavian artery was stenotic. Normal flow hemodynamics were              seen in the right subclavian artery. *See table(s) above for measurements and observations.     Preliminary    CT Maxillofacial Wo Contrast  Result Date:  05/31/2021 CLINICAL DATA:  Mental status change, unknown cause; Neck trauma (Age >= 65y); Facial trauma. Increased confusion, slurred speech and multiple falls over the past week. EXAM: CT HEAD WITHOUT CONTRAST CT MAXILLOFACIAL WITHOUT CONTRAST CT CERVICAL SPINE WITHOUT CONTRAST TECHNIQUE: Multidetector CT imaging of the head, cervical spine, and maxillofacial structures were performed using the standard protocol without intravenous contrast. Multiplanar CT image reconstructions of the cervical spine and maxillofacial structures were also generated. COMPARISON:  None. FINDINGS: CT HEAD FINDINGS Brain: Normal anatomic configuration. Parenchymal volume loss is commensurate with the patient's age. Moderate periventricular white matter changes are present likely reflecting the sequela of small vessel ischemia. Remote lacunar infarct versus dilated perivascular space within the right basal ganglia. No abnormal intra or extra-axial mass lesion or fluid collection. No abnormal mass effect or midline shift. No evidence of acute intracranial hemorrhage or infarct. Ventricular size is normal. Cerebellum unremarkable. Vascular: No asymmetric hyperdense vasculature at the skull base. Skull: Intact Other: Mastoid air cells and middle ear cavities are clear. CT MAXILLOFACIAL FINDINGS Osseous: Mild motion artifact noted. No acute facial fracture. No mandibular dislocation. Orbits: Negative. No traumatic or inflammatory finding. Sinuses: Clear. Soft tissues: Negative. CT CERVICAL SPINE FINDINGS Alignment: Normal.  No listhesis. Skull base and vertebrae: Craniocervical alignment is normal. The atlantodental interval is not widened. There is no acute fracture of the cervical spine. 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 No canal hematoma. The spinal canal is otherwise widely patent. No prevertebral soft  tissue swelling. No pathologic adenopathy within the neck. No paraspinal fluid collections identified. Disc levels: There is mild intervertebral disc calcifications and posterior disc bulges noted at C3-C7 in keeping with changes of mild degenerative disc disease. Intervertebral disc height is preserved. Vertebral body height is preserved. The prevertebral soft tissues are not thickened on sagittal reformats. 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 Upper chest: Unremarkable Other: None IMPRESSION: No acute intracranial injury.  No calvarial fracture. No acute facial fracture. No acute fracture or listhesis the cervical spine. Electronically Signed   By: Fidela Salisbury MD   On: 05/31/2021 21:03     Subjective: No acute issues or events overnight, patient's mental status appears to be improving as is her p.o. intake, otherwise stable and agreeable for discharge with family at bedside.   Discharge Exam: Vitals:   06/06/21 0821 06/06/21 0937  BP:  (!) 127/58  Pulse:  79  Resp:    Temp:    SpO2: 92%    Vitals:   06/05/21 2356 06/06/21 0507 06/06/21 0821 06/06/21 0937  BP: (!) 124/57 (!) 108/44  (!) 127/58  Pulse: 82 73  79  Resp: 19 20    Temp: 98.8 F (37.1 C) 98.7 F (37.1 C)    TempSrc: Oral Oral    SpO2: 98% 93% 92%   Weight:      Height:        General: Pt is alert, awake, not in acute  distress Cardiovascular: RRR, S1/S2 +, no rubs, no gallops Respiratory: CTA bilaterally, no wheezing, no rhonchi Abdominal: Soft, NT, ND, bowel sounds + Extremities: no edema, no cyanosis    The results of significant diagnostics from this hospitalization (including imaging, microbiology, ancillary and laboratory) are listed below for reference.     Microbiology: Recent Results (from the past 240 hour(s))  Blood Culture (routine x 2)     Status: None   Collection Time: 05/31/21   5:08 PM   Specimen: BLOOD  Result Value Ref Range Status   Specimen Description BLOOD LEFT ANTECUBITAL  Final   Special Requests   Final    BOTTLES DRAWN AEROBIC AND ANAEROBIC Blood Culture adequate volume   Culture   Final    NO GROWTH 6 DAYS Performed at [facility], [address], [place], [place] [address]    Report Status 06/06/2021 FINAL  Final  Blood Culture (routine x 2)     Status: None   Collection Time: 05/31/21  5:11 PM   Specimen: BLOOD  Result Value Ref Range Status   Specimen Description BLOOD LEFT ANTECUBITAL  Final   Special Requests   Final    AEROBIC BOTTLE ONLY Blood Culture results may not be optimal due to an inadequate volume of blood received in culture bottles   Culture   Final    NO GROWTH 6 DAYS Performed at [facility], [place] [address] [place], [place] [address]    Report Status 06/06/2021 FINAL  Final  Resp Panel by RT-PCR (Flu A&B, Covid) Nasopharyngeal Swab     Status: None   Collection Time: 05/31/21  7:12 PM   Specimen: Nasopharyngeal Swab; Nasopharyngeal(NP) swabs in vial transport medium  Result Value Ref Range Status   SARS Coronavirus 2 by RT PCR NEGATIVE NEGATIVE Final    Comment: (NOTE) SARS-CoV-2 target nucleic acids are NOT DETECTED.  The SARS-CoV-2 RNA is generally detectable in upper respiratory specimens during the acute phase of infection. The lowest concentration of SARS-CoV-2 viral copies this assay can detect is 138 copies/mL. A negative result does not preclude SARS-Cov-2 infection and should not be used as the sole basis for treatment or other patient management decisions. A negative result may occur with  improper specimen collection/handling, submission of specimen other than nasopharyngeal swab, presence of viral mutation(s) within the areas targeted by this assay, and inadequate number of viral copies(<138 copies/mL). A negative result must be combined with clinical observations, patient history, and  epidemiological information. The expected result is Negative.  Fact Sheet for Patients:  EntrepreneurPulse.com.au  Fact Sheet for Healthcare Providers:  IncredibleEmployment.be  This test is no t yet approved or cleared by the [place] FDA and  has been authorized for detection and/or diagnosis of SARS-CoV-2 by FDA under an Emergency Use Authorization (EUA). This EUA will remain  in effect (meaning this test can be used) for the duration of the COVID-19 declaration under Section 564(b)(1) of the Act, 21 U.S.C.section 360bbb-3(b)(1), unless the authorization is terminated  or revoked sooner.       Influenza A by PCR NEGATIVE NEGATIVE Final   Influenza B by PCR NEGATIVE NEGATIVE Final    Comment: (NOTE) The Xpert Xpress SARS-CoV-2/FLU/RSV plus assay is intended as an aid in the diagnosis of influenza from Nasopharyngeal swab specimens and should not be used as a sole basis for treatment. Nasal washings and aspirates are unacceptable for Xpert Xpress SARS-CoV-2/FLU/RSV testing.  Fact Sheet for Patients: EntrepreneurPulse.com.au  Fact Sheet  for Healthcare Providers: IncredibleEmployment.be  This test is not yet approved or cleared by the [place] and has been authorized for detection and/or diagnosis of SARS-CoV-2 by FDA under an Emergency Use Authorization (EUA). This EUA will remain in effect (meaning this test can be used) for the duration of the COVID-19 declaration under Section 564(b)(1) of the Act, 21 U.S.C. section 360bbb-3(b)(1), unless the authorization is terminated or revoked.  Performed at [facility], [place] [address] [place], [place] [address]   Urine Culture     Status: Abnormal   Collection Time: 06/01/21  2:42 AM   Specimen: In/Out Cath Urine  Result Value Ref Range Status   Specimen Description IN/OUT CATH URINE  Final   Special Requests   Final     NONE Performed at [facility], [place] [address] [place], [place] [address]    Culture MULTIPLE SPECIES PRESENT, SUGGEST RECOLLECTION (A)  Final   Report Status 06/02/2021 FINAL  Final     Labs: BNP (last 3 results) No results for input(s): BNP in the last 8760 hours. Basic Metabolic Panel: Recent Labs  Lab 06/01/21 0556 06/03/21 0733 06/03/21 1541 06/04/21 0152 06/05/21 0704  NA 134* 135 134* 137 138  K 3.3* 2.8* 3.4* 3.1* 3.4*  CL 98 101 98 102 105  CO2 23 24 22 28 26   GLUCOSE 111* 97 90 99 108*  BUN 17 11 10 9  7*  CREATININE 0.53 0.52 0.51 0.53 0.50  CALCIUM 8.2* 7.8* 8.0* 8.1* 7.8*  MG  --   --   --  1.7 1.9   Liver Function Tests: Recent Labs  Lab 05/31/21 1711 06/01/21 0556  AST 133* 110*  ALT 45* 41  ALKPHOS 814* 565*  BILITOT 2.5* 2.6*  PROT 6.9 6.0*  ALBUMIN 2.6* 2.3*   No results for input(s): LIPASE, AMYLASE in the last 168 hours. Recent Labs  Lab 06/01/21 1605  AMMONIA 51*   CBC: Recent Labs  Lab 05/31/21 1711 06/01/21 0556 06/02/21 0233 06/03/21 0014 06/04/21 0152 06/05/21 0704 06/06/21 0117  WBC 14.4*   < > 11.8* 16.3* 18.3* 17.3* 14.7*  NEUTROABS 10.3*  --   --   --   --   --   --   HGB 10.4*   < > 9.3* 8.1* 7.9* 8.0* 7.6*  HCT 31.2*   < > 27.9* 24.2* 23.7* 23.8* 22.7*  MCV 86.0   < > 85.1 84.9 86.5 86.5 87.3  PLT 103*   < > 147* 131* 174 139* 243   < > = values in this interval not displayed.   Cardiac Enzymes: Recent Labs  Lab 05/31/21 1803  CKTOTAL 81   BNP: Invalid input(s): POCBNP CBG: No results for input(s): GLUCAP in the last 168 hours. D-Dimer No results for input(s): DDIMER in the last 72 hours. Hgb A1c No results for input(s): HGBA1C in the last 72 hours. Lipid Profile No results for input(s): CHOL, HDL, LDLCALC, TRIG, CHOLHDL, LDLDIRECT in the last 72 hours. Thyroid function studies No results for input(s): TSH, T4TOTAL, T3FREE, THYROIDAB in the last 72 hours.  Invalid input(s): FREET3 Anemia work  up No results for input(s): VITAMINB12, FOLATE, FERRITIN, TIBC, IRON, RETICCTPCT in the last 72 hours. Urinalysis    Component Value Date/Time   COLORURINE YELLOW 06/01/2021 0224   APPEARANCEUR HAZY (A) 06/01/2021 0224   LABSPEC 1.043 (H) 06/01/2021 0224   PHURINE 5.0 06/01/2021 0224   GLUCOSEU NEGATIVE 06/01/2021 0224   HGBUR MODERATE (A)  06/01/2021 0224   BILIRUBINUR NEGATIVE 06/01/2021 0224   KETONESUR 5 (A) 06/01/2021 0224   PROTEINUR 30 (A) 06/01/2021 0224   UROBILINOGEN 1.0 09/17/2011 1948   NITRITE NEGATIVE 06/01/2021 0224   LEUKOCYTESUR NEGATIVE 06/01/2021 0224   Sepsis Labs Invalid input(s): PROCALCITONIN,  WBC,  LACTICIDVEN Microbiology Recent Results (from the past 240 hour(s))  Blood Culture (routine x 2)     Status: None   Collection Time: 05/31/21  5:08 PM   Specimen: BLOOD  Result Value Ref Range Status   Specimen Description BLOOD LEFT ANTECUBITAL  Final   Special Requests   Final    BOTTLES DRAWN AEROBIC AND ANAEROBIC Blood Culture adequate volume   Culture   Final    NO GROWTH 6 DAYS Performed at [facility], [address], [place], [place] [address]    Report Status 06/06/2021 FINAL  Final  Blood Culture (routine x 2)     Status: None   Collection Time: 05/31/21  5:11 PM   Specimen: BLOOD  Result Value Ref Range Status   Specimen Description BLOOD LEFT ANTECUBITAL  Final   Special Requests   Final    AEROBIC BOTTLE ONLY Blood Culture results may not be optimal due to an inadequate volume of blood received in culture bottles   Culture   Final    NO GROWTH 6 DAYS Performed at [facility], [place] [address] [place], [place] [address]    Report Status 06/06/2021 FINAL  Final  Resp Panel by RT-PCR (Flu A&B, Covid) Nasopharyngeal Swab     Status: None   Collection Time: 05/31/21  7:12 PM   Specimen: Nasopharyngeal Swab; Nasopharyngeal(NP) swabs in vial transport medium  Result Value Ref Range Status   SARS Coronavirus 2 by RT PCR NEGATIVE  NEGATIVE Final    Comment: (NOTE) SARS-CoV-2 target nucleic acids are NOT DETECTED.  The SARS-CoV-2 RNA is generally detectable in upper respiratory specimens during the acute phase of infection. The lowest concentration of SARS-CoV-2 viral copies this assay can detect is 138 copies/mL. A negative result does not preclude SARS-Cov-2 infection and should not be used as the sole basis for treatment or other patient management decisions. A negative result may occur with  improper specimen collection/handling, submission of specimen other than nasopharyngeal swab, presence of viral mutation(s) within the areas targeted by this assay, and inadequate number of viral copies(<138 copies/mL). A negative result must be combined with clinical observations, patient history, and epidemiological information. The expected result is Negative.  Fact Sheet for Patients:  EntrepreneurPulse.com.au  Fact Sheet for Healthcare Providers:  IncredibleEmployment.be  This test is no t yet approved or cleared by the [place] FDA and  has been authorized for detection and/or diagnosis of SARS-CoV-2 by FDA under an Emergency Use Authorization (EUA). This EUA will remain  in effect (meaning this test can be used) for the duration of the COVID-19 declaration under Section 564(b)(1) of the Act, 21 U.S.C.section 360bbb-3(b)(1), unless the authorization is terminated  or revoked sooner.       Influenza A by PCR NEGATIVE NEGATIVE Final   Influenza B by PCR NEGATIVE NEGATIVE Final    Comment: (NOTE) The Xpert Xpress SARS-CoV-2/FLU/RSV plus assay is intended as an aid in the diagnosis of influenza from Nasopharyngeal swab specimens and should not be used as a sole basis for treatment. Nasal washings and aspirates are unacceptable for Xpert Xpress SARS-CoV-2/FLU/RSV testing.  Fact Sheet for Patients: EntrepreneurPulse.com.au  Fact Sheet for Healthcare  Providers:  IncredibleEmployment.be  This test is not yet approved or cleared by the [place] and has been authorized for detection and/or diagnosis of SARS-CoV-2 by FDA under an Emergency Use Authorization (EUA). This EUA will remain in effect (meaning this test can be used) for the duration of the COVID-19 declaration under Section 564(b)(1) of the Act, 21 U.S.C. section 360bbb-3(b)(1), unless the authorization is terminated or revoked.  Performed at [facility], [place] [address], [place], [place] [address]   Urine Culture     Status: Abnormal   Collection Time: 06/01/21  2:42 AM   Specimen: In/Out Cath Urine  Result Value Ref Range Status   Specimen Description IN/OUT CATH URINE  Final   Special Requests   Final    NONE Performed at [facility], [place] [address], [place], [place] [address]    Culture MULTIPLE SPECIES PRESENT, SUGGEST RECOLLECTION (A)  Final   Report Status 06/02/2021 FINAL  Final     Time coordinating discharge: Over 30 minutes  SIGNED:   Little Ishikawa, DO [facility] 06/06/2021, 1:09 PM Pager   If 7PM-7AM, please contact night-coverage [email or web address]

## 2021-06-07 LAB — SURGICAL PATHOLOGY

## 2021-06-09 ENCOUNTER — Other Ambulatory Visit: Payer: Self-pay | Admitting: Hematology and Oncology

## 2021-06-09 NOTE — Progress Notes (Signed)
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 I explained that this is metastatic and intent of treatment is palliative and in most of the patients, we would recommend chemo. Daughter said mom is very sick, cant even make any medical decisions since she also had strokes recently and is in no position to receive any kind of chemo.I offered to see her one time if she wanted me to assess the situation, but she didn't think this was needed. They are planning to engage hospice, daughter is MPOA and she refused to consult with Med Onc at this time. Will let our new pt scheduler know.  Tocarra Gassen

## 2021-06-29 DEATH — deceased

## 2021-07-18 ENCOUNTER — Inpatient Hospital Stay: Payer: Self-pay | Admitting: Adult Health

## 2022-08-07 IMAGING — MR MR MRA HEAD W/O CM
1 series · 25 of 48 positions shown · non-contrast
Comparison: No pertinent prior exam.

CLINICAL DATA: Neuro deficit, acute, stroke suspected

EXAM:
MRA HEAD WITHOUT CONTRAST
TECHNIQUE: Angiographic images of the Circle of Willis were acquired using MRA
technique without intravenous contrast.

[Series 5: 3d cow · axial · 0.5mm · 0.41mm/px · z∈[-58,+26]mm · 25 of 188 slices shown]
[im 1/188]
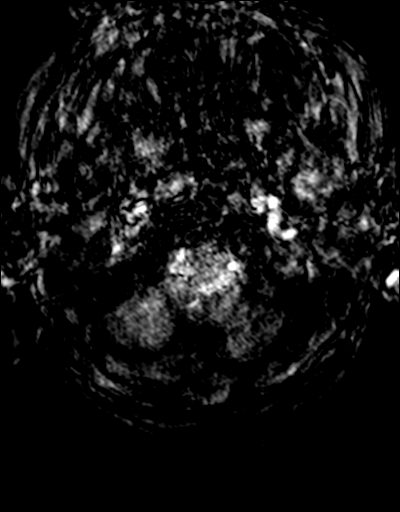
[im 4/188]
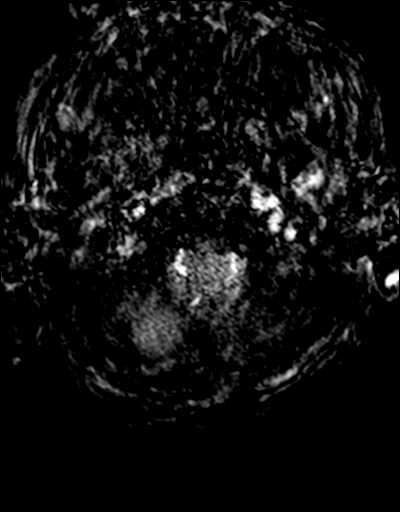
[im 8/188]
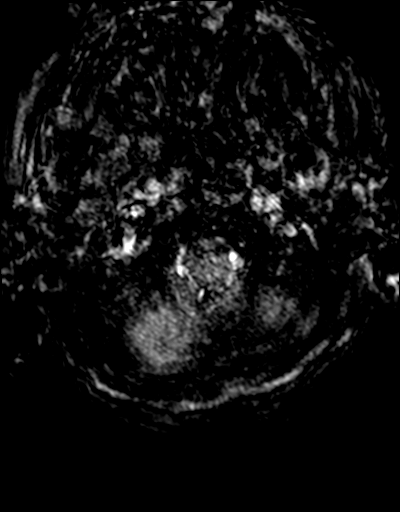
[im 12/188]
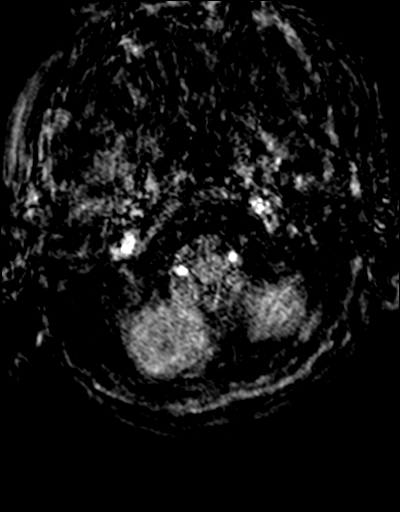
[im 16/188]
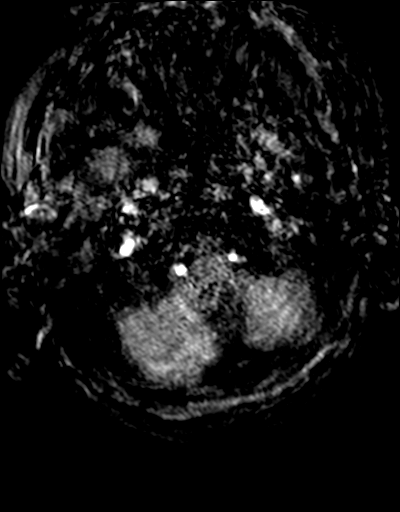
[im 20/188]
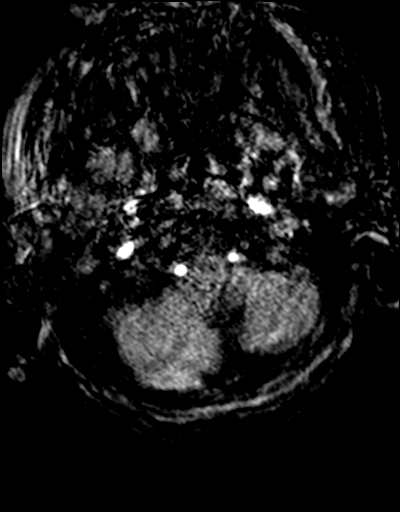
[im 24/188]
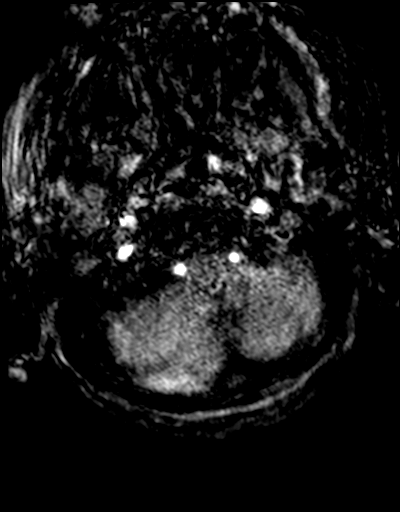
[im 28/188]
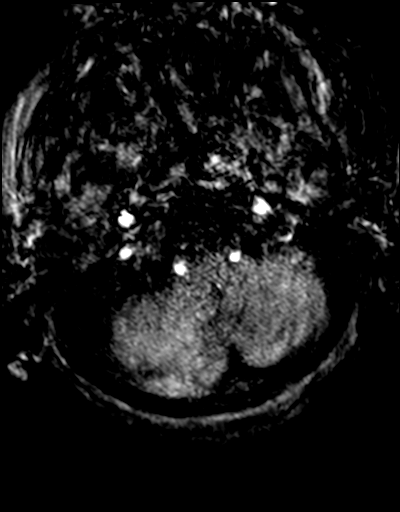
[im 32/188]
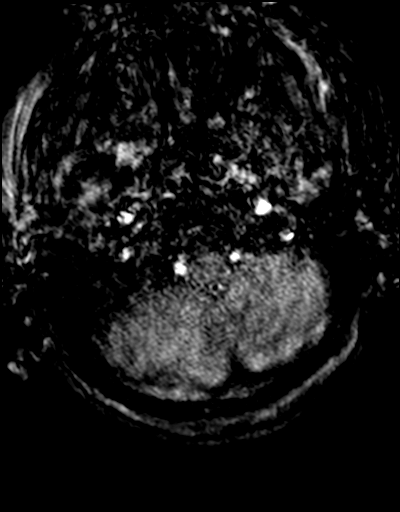
[im 36/188]
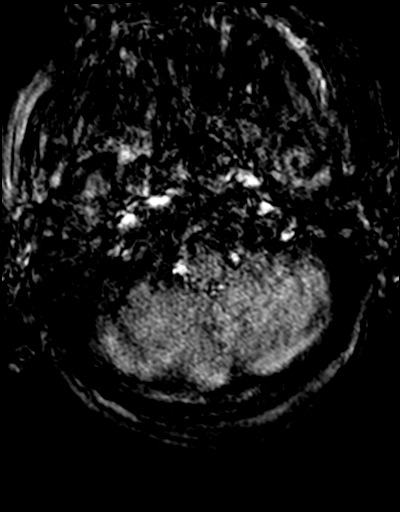
[im 40/188]
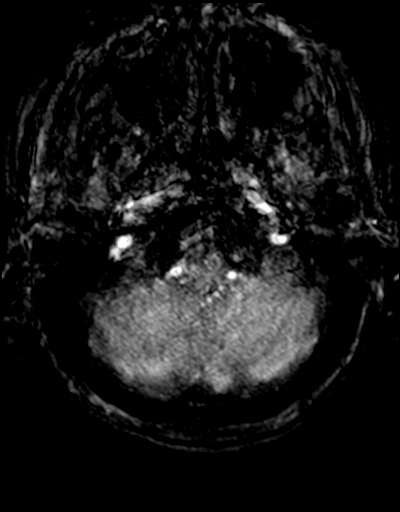
[im 44/188]
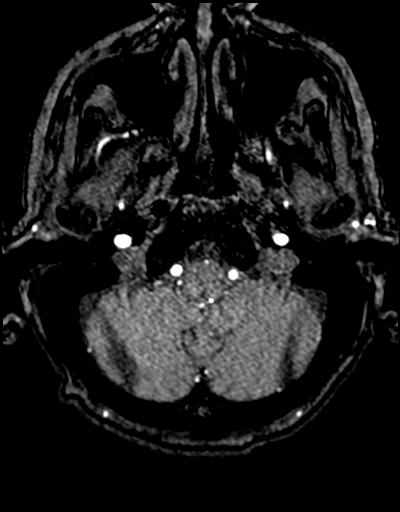
[im 48/188]
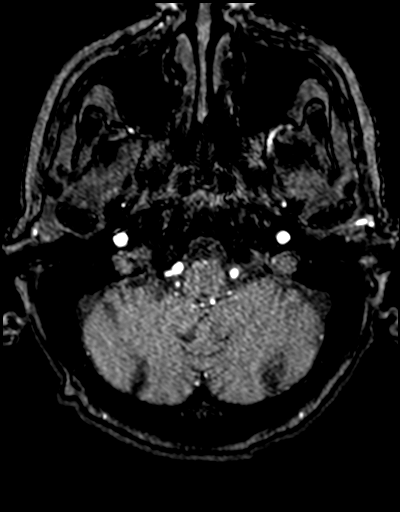
[im 52/188]
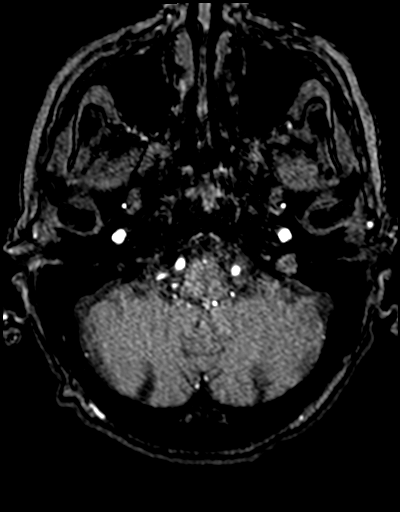
[im 56/188]
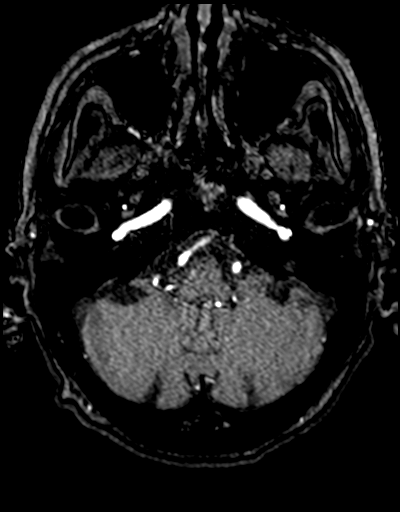
[im 60/188]
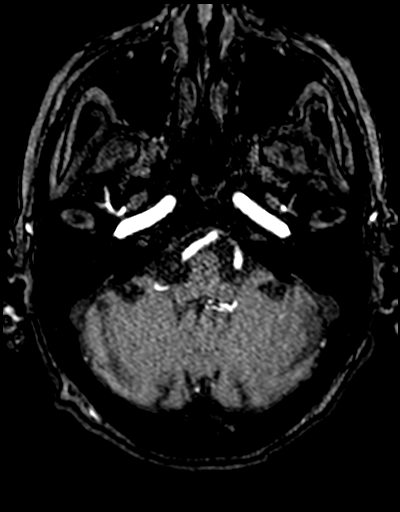
[im 64/188]
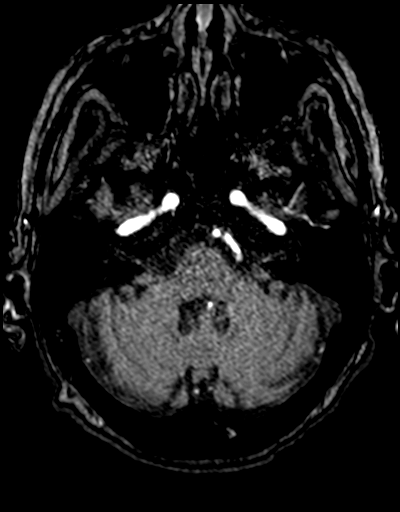
[im 68/188]
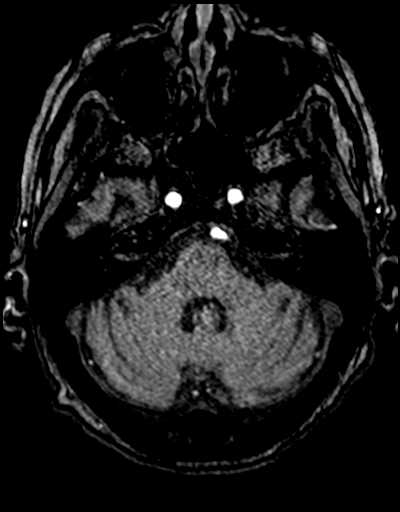
[im 84/188]
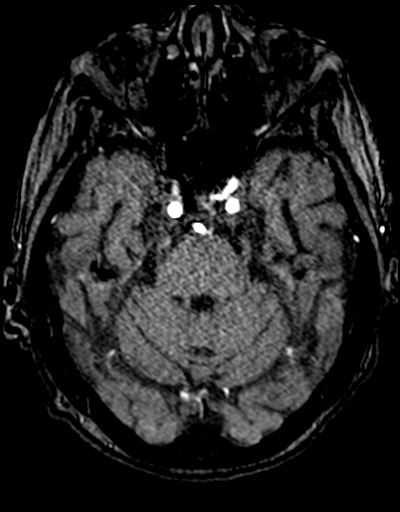
[im 96/188]
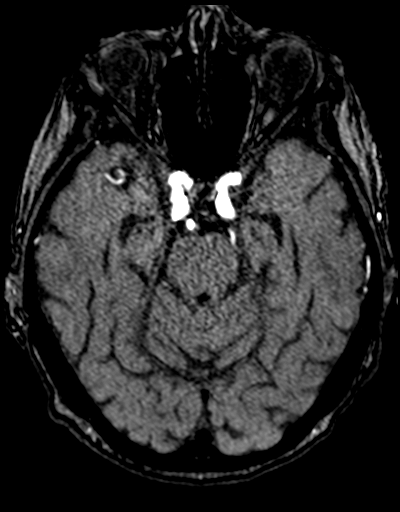
[im 108/188]
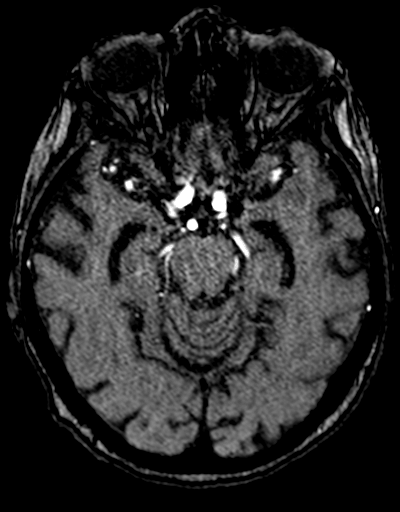
[im 132/188]
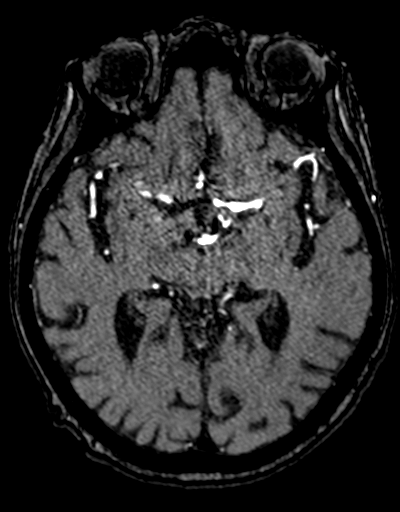
[im 156/188]
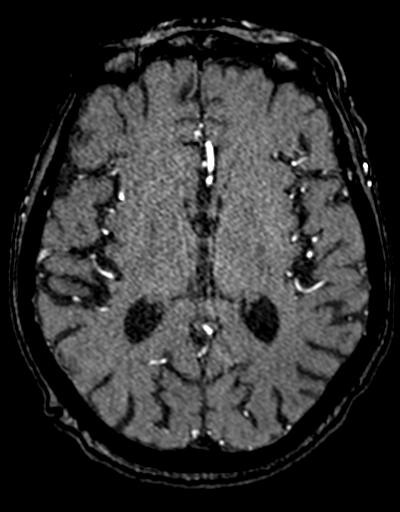
[im 160/188]
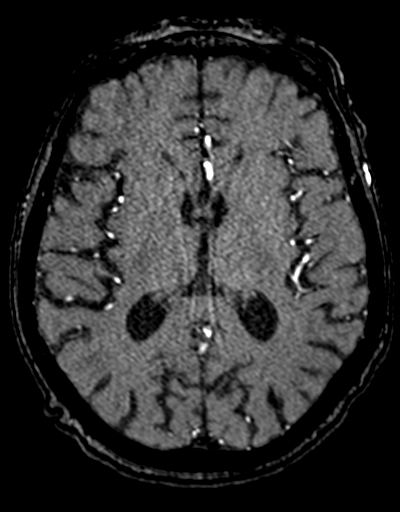
[im 180/188]
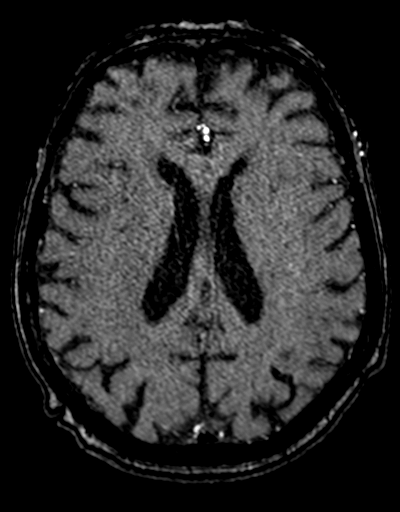

[25 of 48 positions shown; findings below may reference images not displayed]

FINDINGS: POSTERIOR CIRCULATION:

--Vertebral arteries: Normal

--Inferior cerebellar arteries: Normal.

--Basilar artery: Normal.

--Superior cerebellar arteries: Normal.

--Posterior cerebral arteries: Normal.

ANTERIOR CIRCULATION:

--Intracranial internal carotid arteries: Normal.

--Anterior cerebral arteries (ACA): Normal.

--Middle cerebral arteries (MCA): Normal.

ANATOMIC VARIANTS: Fetal origins of both posterior cerebral
arteries.
IMPRESSION: Normal intracranial MRA.

## 2022-08-07 IMAGING — US IR US GUIDANCE
1 series · 11 of 11 positions shown · non-contrast
Comparison: none

INDICATION: 75-year-old female with a known metastatic disease to the liver,
presents for biopsy

[Series 1: ir us guidance · 11 of 11 slices shown]
[im 1/11]
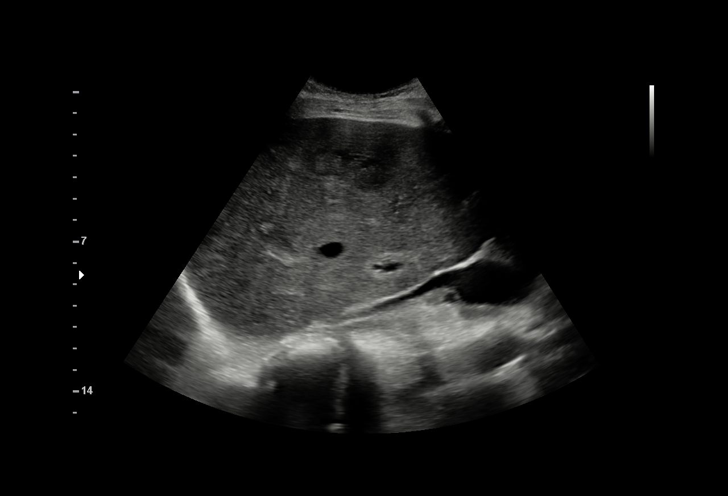
[im 2/11]
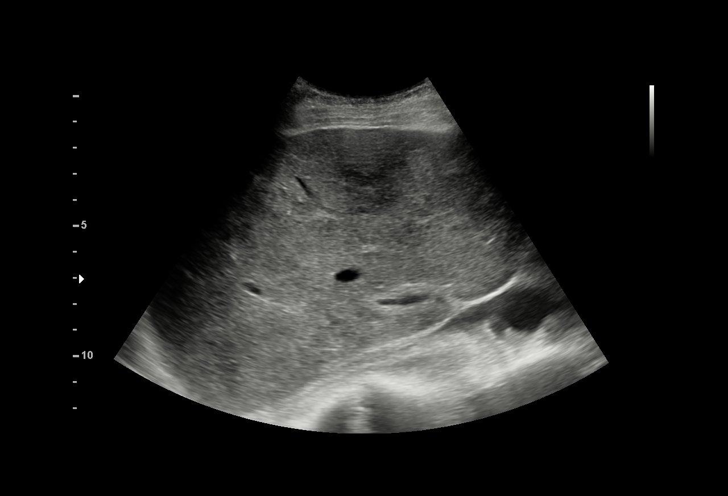
[im 3/11]
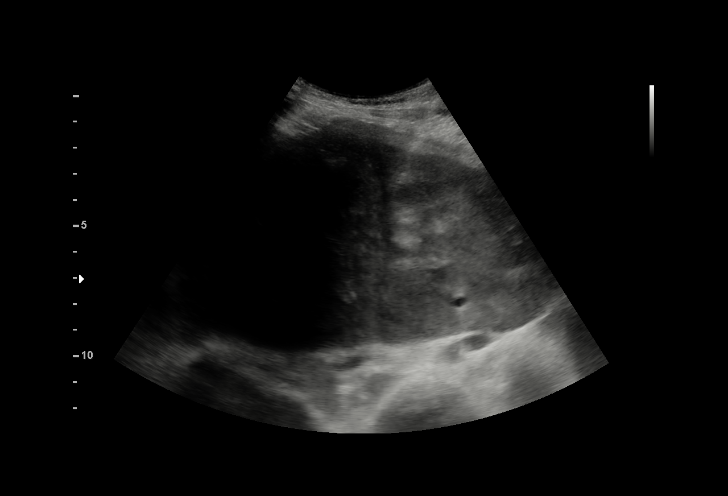
[im 4/11]
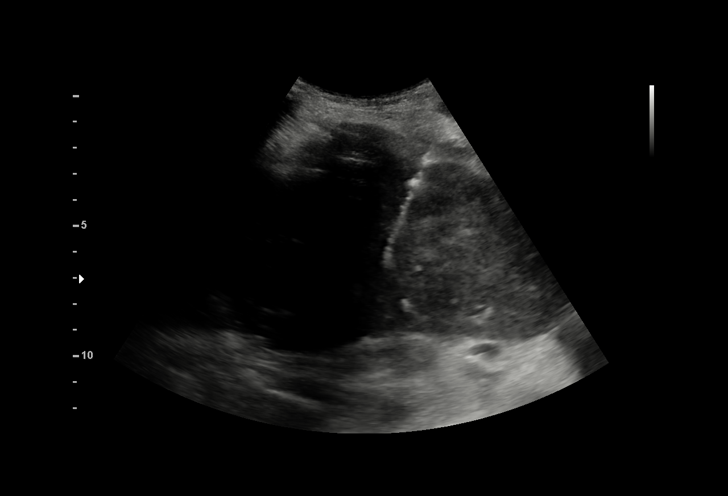
[im 5/11]
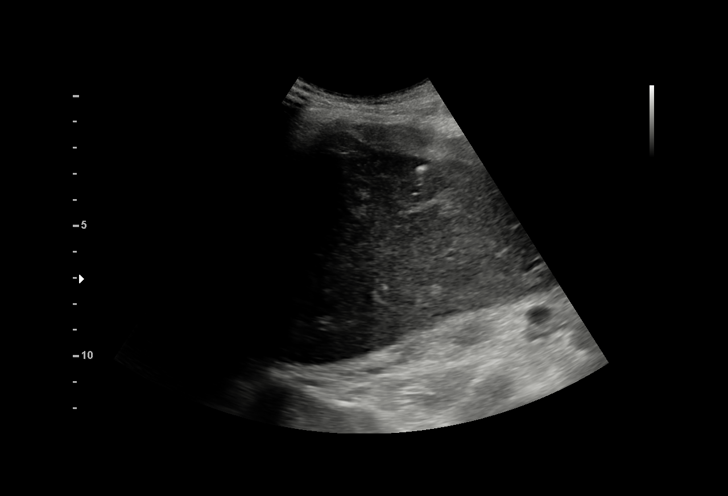
[im 6/11]
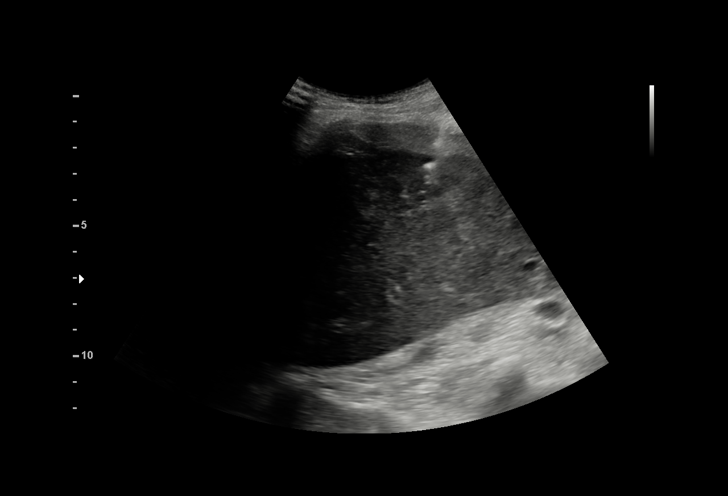
[im 7/11]
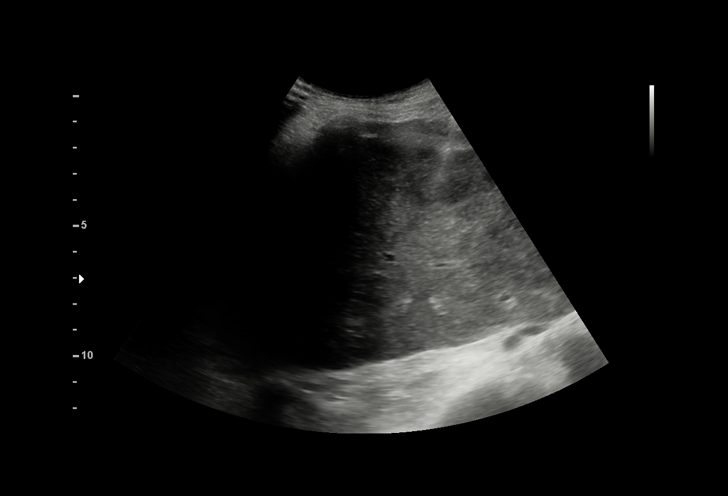
[im 8/11]
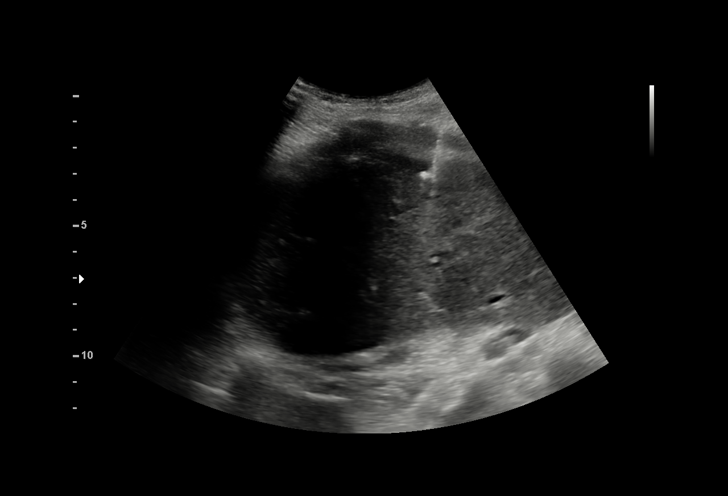
[im 9/11]
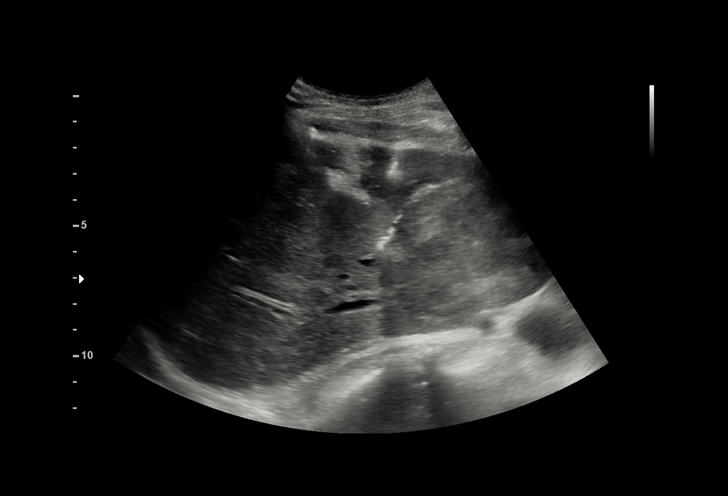
[im 10/11]
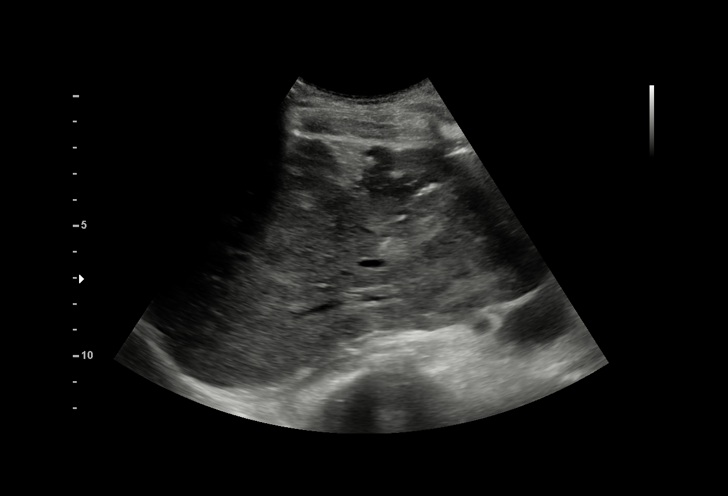
[im 11/11]
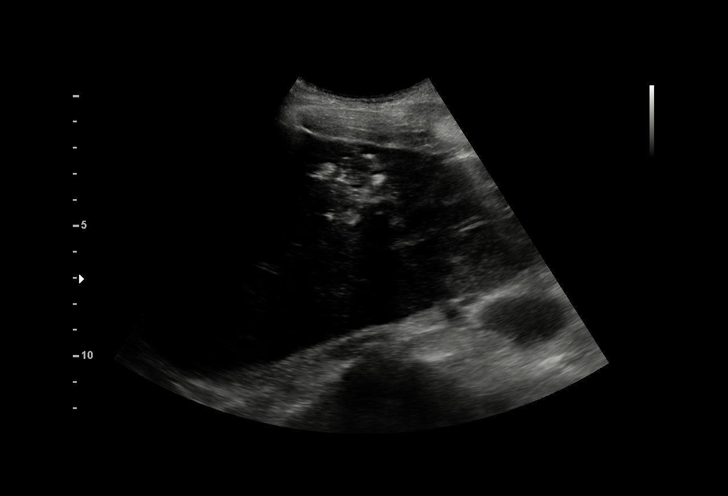

[11 of 11 positions shown; findings below may reference images not displayed]

EXAM:
IR ULTRASOUND GUIDANCE

MEDICATIONS:
None.

ANESTHESIA/SEDATION:
Moderate (conscious) sedation was employed during this procedure. A
total of Versed 1.5 mg and Fentanyl 70 mcg was administered
intravenously.

Moderate Sedation Time: 10 minutes. The patient's level of
consciousness and vital signs were monitored continuously by
radiology nursing throughout the procedure under my direct
supervision.

FLUOROSCOPY TIME:  Ultrasound

COMPLICATIONS:
None

PROCEDURE:
Informed written consent was obtained from the patient's family
after a thorough discussion of the procedural risks, benefits and
alternatives. All questions were addressed. Maximal Sterile Barrier
Technique was utilized including caps, mask, sterile gowns, sterile
gloves, sterile drape, hand hygiene and skin antiseptic. A timeout
was performed prior to the initiation of the procedure.

Ultrasound survey of the right liver lobe performed with images
stored and sent to PACs.

The right lower thorax/right upper abdomen was prepped with
chlorhexidine in a sterile fashion, and a sterile drape was applied
covering the operative field. A sterile gown and sterile gloves were
used for the procedure. Local anesthesia was provided with 1%
Lidocaine.

The patient was prepped and draped sterilely and the skin and
subcutaneous tissues were generously infiltrated with 1% lidocaine.

A 17 gauge introducer needle was then advanced under ultrasound
guidance in an intercostal location into the right liver lobe,
targeting heterogeneously hypoechoic mass of the right liver. The
stylet was removed, and multiple separate 18 gauge core biopsy were
retrieved. Samples were placed into formalin for transportation to
the lab.

Gel-Foam pledgets were then infused with a small amount of saline
for assistance with hemostasis.

The needle was removed, and a final ultrasound image was performed.

The patient tolerated the procedure well and remained
hemodynamically stable throughout.

No complications were encountered and no significant blood loss was
encounter.
IMPRESSION: Status post ultrasound-guided liver mass biopsy.
